# Patient Record
Sex: Female | Born: 1975
Health system: Southern US, Community
[De-identification: ages and names within clinical notes are randomized; demographics above are authoritative.]

## PROBLEM LIST (undated history)

## (undated) DIAGNOSIS — O139 Gestational [pregnancy-induced] hypertension without significant proteinuria, unspecified trimester: Secondary | ICD-10-CM

## (undated) DIAGNOSIS — F419 Anxiety disorder, unspecified: Secondary | ICD-10-CM

## (undated) DIAGNOSIS — I499 Cardiac arrhythmia, unspecified: Secondary | ICD-10-CM

## (undated) DIAGNOSIS — Z8489 Family history of other specified conditions: Secondary | ICD-10-CM

## (undated) DIAGNOSIS — K5900 Constipation, unspecified: Secondary | ICD-10-CM

## (undated) DIAGNOSIS — J45909 Unspecified asthma, uncomplicated: Secondary | ICD-10-CM

## (undated) DIAGNOSIS — R51 Headache: Secondary | ICD-10-CM

## (undated) DIAGNOSIS — I10 Essential (primary) hypertension: Secondary | ICD-10-CM

## (undated) DIAGNOSIS — K219 Gastro-esophageal reflux disease without esophagitis: Secondary | ICD-10-CM

## (undated) HISTORY — DX: Essential (primary) hypertension: I10

## (undated) HISTORY — DX: Constipation, unspecified: K59.00

## (undated) HISTORY — DX: Unspecified asthma, uncomplicated: J45.909

---

## 2007-12-22 ENCOUNTER — Emergency Department (HOSPITAL_BASED_OUTPATIENT_CLINIC_OR_DEPARTMENT_OTHER): Admission: EM | Admit: 2007-12-22 | Discharge: 2007-12-22 | Payer: Self-pay | Admitting: Emergency Medicine

## 2009-08-13 ENCOUNTER — Inpatient Hospital Stay (HOSPITAL_COMMUNITY): Admission: AD | Admit: 2009-08-13 | Discharge: 2009-08-16 | Payer: Self-pay | Admitting: Obstetrics

## 2010-05-01 LAB — CBC
HCT: 30.8 % — ABNORMAL LOW (ref 36.0–46.0)
HCT: 37 % (ref 36.0–46.0)
Hemoglobin: 10.8 g/dL — ABNORMAL LOW (ref 12.0–15.0)
Hemoglobin: 12.6 g/dL (ref 12.0–15.0)
MCH: 31.1 pg (ref 26.0–34.0)
MCHC: 34.1 g/dL (ref 30.0–36.0)
MCHC: 34.2 g/dL (ref 30.0–36.0)
MCV: 91.3 fL (ref 78.0–100.0)
Platelets: 160 10*3/uL (ref 150–400)
Platelets: 200 10*3/uL (ref 150–400)
RBC: 3.39 MIL/uL — ABNORMAL LOW (ref 3.87–5.11)
RBC: 4.04 MIL/uL (ref 3.87–5.11)

## 2010-05-01 LAB — COMPREHENSIVE METABOLIC PANEL
ALT: 13 U/L (ref 0–35)
AST: 17 U/L (ref 0–37)
Albumin: 2.8 g/dL — ABNORMAL LOW (ref 3.5–5.2)
CO2: 21 mEq/L (ref 19–32)
Calcium: 9.1 mg/dL (ref 8.4–10.5)
Chloride: 106 mEq/L (ref 96–112)
Creatinine, Ser: 0.5 mg/dL (ref 0.4–1.2)
GFR calc Af Amer: >60 mL/min (ref >60)
Total Protein: 6.1 g/dL (ref 6.0–8.3)

## 2010-05-01 LAB — RPR: RPR Ser Ql: NONREACTIVE

## 2010-05-01 LAB — RH IMMUNE GLOB WKUP(>/=20WKS)(NOT WOMEN'S HOSP)

## 2010-11-15 LAB — BASIC METABOLIC PANEL
BUN: 9
Creatinine, Ser: 0.7
GFR calc Af Amer: >60
Glucose, Bld: 96
Potassium: 3.8

## 2010-11-15 LAB — CBC
HCT: 40.5
Hemoglobin: 13.3
MCHC: 32.8
Platelets: 302

## 2010-11-15 LAB — PREGNANCY, URINE: Preg Test, Ur: NEGATIVE

## 2010-11-15 LAB — DIFFERENTIAL
Basophils Absolute: 0
Eosinophils Absolute: 0.1
Eosinophils Relative: 1
Lymphs Abs: 1.2
Monocytes Absolute: 0.3
Neutro Abs: 6.8
Neutrophils Relative %: 81 — ABNORMAL HIGH

## 2011-09-14 LAB — HM PAP SMEAR

## 2011-11-23 LAB — OB RESULTS CONSOLE GC/CHLAMYDIA
Chlamydia: NEGATIVE
Gonorrhea: NEGATIVE

## 2011-11-23 LAB — OB RESULTS CONSOLE RPR: RPR: NONREACTIVE

## 2011-11-23 LAB — OB RESULTS CONSOLE HIV ANTIBODY (ROUTINE TESTING): HIV: NONREACTIVE

## 2012-04-12 ENCOUNTER — Other Ambulatory Visit: Payer: Self-pay | Admitting: Obstetrics and Gynecology

## 2012-05-20 ENCOUNTER — Encounter (HOSPITAL_COMMUNITY): Payer: Self-pay | Admitting: Pharmacy Technician

## 2012-05-23 ENCOUNTER — Encounter (HOSPITAL_COMMUNITY): Payer: Self-pay

## 2012-05-27 ENCOUNTER — Encounter (HOSPITAL_COMMUNITY): Payer: Self-pay

## 2012-05-27 ENCOUNTER — Encounter (HOSPITAL_COMMUNITY)
Admission: RE | Admit: 2012-05-27 | Discharge: 2012-05-27 | Disposition: A | Discharge status: Home / Self Care | Payer: 59 | Source: Ambulatory Visit | Attending: Obstetrics and Gynecology | Admitting: Obstetrics and Gynecology

## 2012-05-27 HISTORY — DX: Cardiac arrhythmia, unspecified: I49.9

## 2012-05-27 HISTORY — DX: Gastro-esophageal reflux disease without esophagitis: K21.9

## 2012-05-27 HISTORY — DX: Headache: R51

## 2012-05-27 HISTORY — DX: Anxiety disorder, unspecified: F41.9

## 2012-05-27 LAB — CBC
Hemoglobin: 11.7 g/dL — ABNORMAL LOW (ref 12.0–15.0)
MCH: 30.3 pg (ref 26.0–34.0)
RBC: 3.86 MIL/uL — ABNORMAL LOW (ref 3.87–5.11)

## 2012-05-27 LAB — RPR: RPR Ser Ql: NONREACTIVE

## 2012-05-27 NOTE — Patient Instructions (Addendum)
   Your procedure is scheduled on:Wednesday April 16th  Enter through the [employer] of [facility] at:7am Pick up the phone at the desk and dial [phone] and inform us of your arrival.  Please call this number if you have any problems the morning of surgery: [phone]  Remember: Do not eat or drink anything after midnight on Tuesday Take these medicines the morning of surgery with a SIP OF WATER: Zantac  Do not wear jewelry, make-up, or FINGER nail polish No metal in your hair or on your body. Do not wear lotions, powders, perfumes. You may wear deodorant.  Please use your CHG wash as directed prior to surgery.  Do not shave anywhere for at least 12 hours prior to first CHG shower.  Do not bring valuables to the hospital.  Leave suitcase in the car. After Surgery it may be brought to your room. For patients being admitted to the hospital, checkout time is 11:00am the day of discharge.

## 2012-05-29 ENCOUNTER — Encounter (HOSPITAL_COMMUNITY)
Admission: AD | Disposition: A | Discharge status: Home / Self Care | Payer: Self-pay | Source: Ambulatory Visit | Attending: Obstetrics and Gynecology

## 2012-05-29 ENCOUNTER — Inpatient Hospital Stay (HOSPITAL_COMMUNITY): Payer: 59 | Admitting: Anesthesiology

## 2012-05-29 ENCOUNTER — Encounter (HOSPITAL_COMMUNITY): Payer: Self-pay | Admitting: Anesthesiology

## 2012-05-29 ENCOUNTER — Inpatient Hospital Stay (HOSPITAL_COMMUNITY)
Admission: AD | Admit: 2012-05-29 | Discharge: 2012-06-01 | DRG: 766 | Disposition: A | Discharge status: Home / Self Care | Payer: 59 | Source: Ambulatory Visit | Attending: Obstetrics and Gynecology | Admitting: Obstetrics and Gynecology

## 2012-05-29 DIAGNOSIS — O34219 Maternal care for unspecified type scar from previous cesarean delivery: Principal | ICD-10-CM | POA: Diagnosis present

## 2012-05-29 SURGERY — Surgical Case
Anesthesia: Spinal | Site: Abdomen | Wound class: Clean Contaminated

## 2012-05-29 MED ORDER — SODIUM CHLORIDE 0.9 % IJ SOLN: 3.0000 mL | INTRAMUSCULAR | Status: DC | PRN

## 2012-05-29 MED ORDER — IBUPROFEN 600 MG PO TABS
600.0000 mg | ORAL_TABLET | Freq: Four times a day (QID) | ORAL | Status: DC
  Administered 2012-05-29 – 2012-06-01 (×11): 600 mg via ORAL
  Filled 2012-05-29 (×11): qty 1

## 2012-05-29 MED ORDER — CEFAZOLIN SODIUM-DEXTROSE 2-3 GM-% IV SOLR: 2.0000 g | INTRAVENOUS | Status: DC

## 2012-05-29 MED ORDER — BUPIVACAINE IN DEXTROSE 0.75-8.25 % IT SOLN
INTRATHECAL | Status: DC | PRN
  Administered 2012-05-29: 1.8 mL via INTRATHECAL

## 2012-05-29 MED ORDER — LACTATED RINGERS IV SOLN
INTRAVENOUS | Status: DC
  Administered 2012-05-29 (×3): via INTRAVENOUS

## 2012-05-29 MED ORDER — METOCLOPRAMIDE HCL 5 MG/ML IJ SOLN: 10.0000 mg | Freq: Three times a day (TID) | INTRAMUSCULAR | Status: DC | PRN

## 2012-05-29 MED ORDER — ONDANSETRON HCL 4 MG/2ML IJ SOLN
INTRAMUSCULAR | Status: AC
  Filled 2012-05-29: qty 2

## 2012-05-29 MED ORDER — DIPHENHYDRAMINE HCL 25 MG PO CAPS: 25.0000 mg | ORAL_CAPSULE | Freq: Four times a day (QID) | ORAL | Status: DC | PRN

## 2012-05-29 MED ORDER — SCOPOLAMINE 1 MG/3DAYS TD PT72: 1.0000 | MEDICATED_PATCH | Freq: Once | TRANSDERMAL | Status: DC

## 2012-05-29 MED ORDER — NALOXONE HCL 1 MG/ML IJ SOLN
1.0000 ug/kg/h | INTRAVENOUS | Status: DC | PRN
  Filled 2012-05-29: qty 2

## 2012-05-29 MED ORDER — TETANUS-DIPHTH-ACELL PERTUSSIS 5-2.5-18.5 LF-MCG/0.5 IM SUSP: 0.5000 mL | Freq: Once | INTRAMUSCULAR | Status: DC

## 2012-05-29 MED ORDER — MEPERIDINE HCL 25 MG/ML IJ SOLN: 6.2500 mg | INTRAMUSCULAR | Status: DC | PRN

## 2012-05-29 MED ORDER — DIBUCAINE 1 % RE OINT: 1.0000 "application " | TOPICAL_OINTMENT | RECTAL | Status: DC | PRN

## 2012-05-29 MED ORDER — FENTANYL CITRATE 0.05 MG/ML IJ SOLN
INTRAMUSCULAR | Status: DC | PRN
  Administered 2012-05-29: 25 ug via INTRATHECAL

## 2012-05-29 MED ORDER — KETOROLAC TROMETHAMINE 30 MG/ML IJ SOLN: 30.0000 mg | Freq: Four times a day (QID) | INTRAMUSCULAR | Status: DC | PRN

## 2012-05-29 MED ORDER — ONDANSETRON HCL 4 MG/2ML IJ SOLN: 4.0000 mg | Freq: Three times a day (TID) | INTRAMUSCULAR | Status: DC | PRN

## 2012-05-29 MED ORDER — SIMETHICONE 80 MG PO CHEW
80.0000 mg | CHEWABLE_TABLET | Freq: Three times a day (TID) | ORAL | Status: DC
  Administered 2012-05-29 – 2012-06-01 (×8): 80 mg via ORAL

## 2012-05-29 MED ORDER — SCOPOLAMINE 1 MG/3DAYS TD PT72
MEDICATED_PATCH | TRANSDERMAL | Status: AC
  Administered 2012-05-29: 1.5 mg via TRANSDERMAL
  Filled 2012-05-29: qty 1

## 2012-05-29 MED ORDER — WITCH HAZEL-GLYCERIN EX PADS: 1.0000 "application " | MEDICATED_PAD | CUTANEOUS | Status: DC | PRN

## 2012-05-29 MED ORDER — NALBUPHINE HCL 10 MG/ML IJ SOLN
5.0000 mg | INTRAMUSCULAR | Status: DC | PRN
  Filled 2012-05-29: qty 1

## 2012-05-29 MED ORDER — ACETAMINOPHEN 10 MG/ML IV SOLN
1000.0000 mg | Freq: Four times a day (QID) | INTRAVENOUS | Status: AC | PRN
  Filled 2012-05-29: qty 100

## 2012-05-29 MED ORDER — SENNOSIDES-DOCUSATE SODIUM 8.6-50 MG PO TABS
2.0000 | ORAL_TABLET | Freq: Every day | ORAL | Status: DC
  Administered 2012-05-29 – 2012-05-31 (×3): 2 via ORAL

## 2012-05-29 MED ORDER — DIPHENHYDRAMINE HCL 50 MG/ML IJ SOLN: 12.5000 mg | INTRAMUSCULAR | Status: DC | PRN

## 2012-05-29 MED ORDER — LACTATED RINGERS IV SOLN
Freq: Once | INTRAVENOUS | Status: AC
  Administered 2012-05-29: 09:00:00 via INTRAVENOUS

## 2012-05-29 MED ORDER — ONDANSETRON HCL 4 MG/2ML IJ SOLN
INTRAMUSCULAR | Status: DC | PRN
  Administered 2012-05-29: 4 mg via INTRAVENOUS

## 2012-05-29 MED ORDER — ONDANSETRON HCL 4 MG/2ML IJ SOLN: 4.0000 mg | INTRAMUSCULAR | Status: DC | PRN

## 2012-05-29 MED ORDER — LACTATED RINGERS IV SOLN
INTRAVENOUS | Status: DC
  Administered 2012-05-29: 18:00:00 via INTRAVENOUS

## 2012-05-29 MED ORDER — SIMETHICONE 80 MG PO CHEW
80.0000 mg | CHEWABLE_TABLET | ORAL | Status: DC | PRN
  Administered 2012-05-29 – 2012-05-30 (×2): 80 mg via ORAL

## 2012-05-29 MED ORDER — NALBUPHINE HCL 10 MG/ML IJ SOLN: 5.0000 mg | INTRAMUSCULAR | Status: DC | PRN

## 2012-05-29 MED ORDER — PROMETHAZINE HCL 25 MG/ML IJ SOLN: 6.2500 mg | INTRAMUSCULAR | Status: DC | PRN

## 2012-05-29 MED ORDER — OXYTOCIN 10 UNIT/ML IJ SOLN
INTRAMUSCULAR | Status: AC
  Filled 2012-05-29: qty 4

## 2012-05-29 MED ORDER — MORPHINE SULFATE (PF) 0.5 MG/ML IJ SOLN
INTRAMUSCULAR | Status: DC | PRN
  Administered 2012-05-29: .15 mg via INTRATHECAL

## 2012-05-29 MED ORDER — OXYTOCIN 40 UNITS IN LACTATED RINGERS INFUSION - SIMPLE MED
INTRAVENOUS | Status: DC | PRN
  Administered 2012-05-29: 40 [IU] via INTRAVENOUS

## 2012-05-29 MED ORDER — LANOLIN HYDROUS EX OINT: 1.0000 "application " | TOPICAL_OINTMENT | CUTANEOUS | Status: DC | PRN

## 2012-05-29 MED ORDER — DIPHENHYDRAMINE HCL 25 MG PO CAPS: 25.0000 mg | ORAL_CAPSULE | ORAL | Status: DC | PRN

## 2012-05-29 MED ORDER — ONDANSETRON HCL 4 MG PO TABS: 4.0000 mg | ORAL_TABLET | ORAL | Status: DC | PRN

## 2012-05-29 MED ORDER — PRENATAL MULTIVITAMIN CH
1.0000 | ORAL_TABLET | Freq: Every day | ORAL | Status: DC
  Administered 2012-05-30 – 2012-06-01 (×3): 1 via ORAL
  Filled 2012-05-29 (×3): qty 1

## 2012-05-29 MED ORDER — SCOPOLAMINE 1 MG/3DAYS TD PT72
1.0000 | MEDICATED_PATCH | Freq: Once | TRANSDERMAL | Status: DC
  Filled 2012-05-29: qty 1

## 2012-05-29 MED ORDER — PHENYLEPHRINE 40 MCG/ML (10ML) SYRINGE FOR IV PUSH (FOR BLOOD PRESSURE SUPPORT)
PREFILLED_SYRINGE | INTRAVENOUS | Status: AC
  Filled 2012-05-29: qty 5

## 2012-05-29 MED ORDER — DIPHENHYDRAMINE HCL 50 MG/ML IJ SOLN: 25.0000 mg | INTRAMUSCULAR | Status: DC | PRN

## 2012-05-29 MED ORDER — ZOLPIDEM TARTRATE 5 MG PO TABS: 5.0000 mg | ORAL_TABLET | Freq: Every evening | ORAL | Status: DC | PRN

## 2012-05-29 MED ORDER — CEFAZOLIN SODIUM-DEXTROSE 2-3 GM-% IV SOLR
INTRAVENOUS | Status: AC
  Administered 2012-05-29: 2 g via INTRAVENOUS
  Filled 2012-05-29: qty 50

## 2012-05-29 MED ORDER — FENTANYL CITRATE 0.05 MG/ML IJ SOLN
INTRAMUSCULAR | Status: AC
  Filled 2012-05-29: qty 2

## 2012-05-29 MED ORDER — OXYTOCIN 40 UNITS IN LACTATED RINGERS INFUSION - SIMPLE MED: 62.5000 mL/h | INTRAVENOUS | Status: AC

## 2012-05-29 MED ORDER — FENTANYL CITRATE 0.05 MG/ML IJ SOLN: 25.0000 ug | INTRAMUSCULAR | Status: DC | PRN

## 2012-05-29 MED ORDER — NALOXONE HCL 0.4 MG/ML IJ SOLN: 0.4000 mg | INTRAMUSCULAR | Status: DC | PRN

## 2012-05-29 MED ORDER — OXYCODONE-ACETAMINOPHEN 5-325 MG PO TABS
1.0000 | ORAL_TABLET | ORAL | Status: DC | PRN
  Administered 2012-05-30: 1 via ORAL
  Administered 2012-05-31 (×2): 0.5 via ORAL
  Filled 2012-05-29 (×4): qty 1

## 2012-05-29 MED ORDER — KETOROLAC TROMETHAMINE 30 MG/ML IJ SOLN: 15.0000 mg | Freq: Once | INTRAMUSCULAR | Status: DC | PRN

## 2012-05-29 MED ORDER — MORPHINE SULFATE 0.5 MG/ML IJ SOLN
INTRAMUSCULAR | Status: AC
  Filled 2012-05-29: qty 10

## 2012-05-29 MED ORDER — EPHEDRINE 5 MG/ML INJ
INTRAVENOUS | Status: AC
  Filled 2012-05-29: qty 10

## 2012-05-29 MED ORDER — NALBUPHINE HCL 10 MG/ML IJ SOLN: 2.5000 mg | INTRAMUSCULAR | Status: DC | PRN

## 2012-05-29 MED ORDER — FERROUS SULFATE 325 (65 FE) MG PO TABS
325.0000 mg | ORAL_TABLET | Freq: Every day | ORAL | Status: DC
  Administered 2012-05-30 – 2012-06-01 (×3): 325 mg via ORAL
  Filled 2012-05-29 (×3): qty 1

## 2012-05-29 MED ORDER — PHENYLEPHRINE HCL 10 MG/ML IJ SOLN
INTRAMUSCULAR | Status: DC | PRN
  Administered 2012-05-29: 80 ug via INTRAVENOUS
  Administered 2012-05-29: 40 ug via INTRAVENOUS
  Administered 2012-05-29: 80 ug via INTRAVENOUS
  Administered 2012-05-29: 40 ug via INTRAVENOUS
  Administered 2012-05-29: 80 ug via INTRAVENOUS

## 2012-05-29 MED ORDER — KETOROLAC TROMETHAMINE 30 MG/ML IJ SOLN
INTRAMUSCULAR | Status: AC
  Administered 2012-05-29: 30 mg via INTRAVENOUS
  Filled 2012-05-29: qty 1

## 2012-05-29 MED ORDER — MENTHOL 3 MG MT LOZG: 1.0000 | LOZENGE | OROMUCOSAL | Status: DC | PRN

## 2012-05-29 MED ORDER — MIDAZOLAM HCL 2 MG/2ML IJ SOLN: 0.5000 mg | Freq: Once | INTRAMUSCULAR | Status: DC | PRN

## 2012-05-29 SURGICAL SUPPLY — 29 items
CLOTH BEACON ORANGE TIMEOUT ST (SAFETY) ×2 IMPLANT
DRAPE LG THREE QUARTER DISP (DRAPES) ×2 IMPLANT
DRSG OPSITE POSTOP 4X10 (GAUZE/BANDAGES/DRESSINGS) ×2 IMPLANT
DURAPREP 26ML APPLICATOR (WOUND CARE) ×2 IMPLANT
ELECT REM PT RETURN 9FT ADLT (ELECTROSURGICAL) ×2
ELECTRODE REM PT RTRN 9FT ADLT (ELECTROSURGICAL) ×1 IMPLANT
EXTRACTOR VACUUM M CUP 4 TUBE (SUCTIONS) IMPLANT
GLOVE BIO SURGEON STRL SZ 6.5 (GLOVE) ×2 IMPLANT
GLOVE BIOGEL PI IND STRL 6.5 (GLOVE) ×1 IMPLANT
GLOVE BIOGEL PI INDICATOR 6.5 (GLOVE) ×1
GOWN STRL REIN XL XLG (GOWN DISPOSABLE) ×4 IMPLANT
KIT ABG SYR 3ML LUER SLIP (SYRINGE) IMPLANT
NEEDLE HYPO 25X5/8 SAFETYGLIDE (NEEDLE) IMPLANT
NS IRRIG 1000ML POUR BTL (IV SOLUTION) ×2 IMPLANT
PACK C SECTION WH (CUSTOM PROCEDURE TRAY) ×2 IMPLANT
PAD OB MATERNITY 4.3X12.25 (PERSONAL CARE ITEMS) ×2 IMPLANT
RTRCTR C-SECT PINK 25CM LRG (MISCELLANEOUS) ×2 IMPLANT
SLEEVE SCD COMPRESS KNEE MED (MISCELLANEOUS) IMPLANT
STAPLER VISISTAT 35W (STAPLE) ×2 IMPLANT
SUT MON AB 2-0 CT1 27 (SUTURE) ×2 IMPLANT
SUT MON AB 4-0 PS1 27 (SUTURE) IMPLANT
SUT PDS AB 0 CTX 60 (SUTURE) IMPLANT
SUT PLAIN 2 0 XLH (SUTURE) IMPLANT
SUT VIC AB 0 CTX 36 (SUTURE) ×4
SUT VIC AB 0 CTX36XBRD ANBCTRL (SUTURE) ×4 IMPLANT
SUT VIC AB 4-0 KS 27 (SUTURE) IMPLANT
TOWEL OR 17X24 6PK STRL BLUE (TOWEL DISPOSABLE) ×6 IMPLANT
TRAY FOLEY CATH 14FR (SET/KITS/TRAYS/PACK) ×2 IMPLANT
WATER STERILE IRR 1000ML POUR (IV SOLUTION) ×2 IMPLANT

## 2012-05-29 NOTE — Brief Op Note (Signed)
05/29/2012  9:25 AM  PATIENT:  Cassie Kim  37 y.o. female  PRE-OPERATIVE DIAGNOSIS:  REPEAT cesarean section   POST-OPERATIVE DIAGNOSIS:  REPEAT cesarean section, Lower uterine segment extremely thin with window to amniotic sac that was quite large,  This was discussed with patient and husband at time of surgery.  PROCEDURE:  Procedure(s): CESAREAN SECTION (N/A)  SURGEON:  Surgeon(s) and Role:    * Philip Aspen, DO - Primary    * Miguel Aschoff, MD - Assisting    ANESTHESIA:   spinal  EBL:  Total I/O In: 3100 [I.V.:3100] Out: 700 [Urine:200; Blood:500]  BLOOD ADMINISTERED:none  DRAINS: none   LOCAL MEDICATIONS USED:  NONE  SPECIMEN:  Source of Specimen:  cord blood  DISPOSITION OF SPECIMEN:  N/A  COUNTS:  YES  TOURNIQUET:  * No tourniquets in log *  DICTATION: .Other Dictation: Dictation Number    PLAN OF CARE: Admit to inpatient   PATIENT DISPOSITION:  PACU - hemodynamically stable.   Delay start of Pharmacological VTE agent (>24hrs) due to surgical blood loss or risk of bleeding: N/A

## 2012-05-29 NOTE — Anesthesia Postprocedure Evaluation (Signed)
  Anesthesia Post-op Note  Patient: Cassie Kim  Procedure(s) Performed: Procedure(s): CESAREAN SECTION (N/A)  Patient Location: PACU  Anesthesia Type:Spinal  Level of Consciousness: awake, alert  and oriented  Airway and Oxygen Therapy: Patient Spontanous Breathing  Post-op Pain: none  Post-op Assessment: Post-op Vital signs reviewed, Patient's Cardiovascular Status Stable, Respiratory Function Stable, Patent Airway, No signs of Nausea or vomiting, Pain level controlled, No headache, No backache, No residual numbness and No residual motor weakness  Post-op Vital Signs: Reviewed and stable  Complications: No apparent anesthesia complications

## 2012-05-29 NOTE — Anesthesia Procedure Notes (Signed)
Spinal  Patient location during procedure: OR Start time: 05/29/2012 8:25 AM Staffing Anesthesiologist: Jevin Camino A. Performed by: anesthesiologist  Preanesthetic Checklist Completed: patient identified, site marked, surgical consent, pre-op evaluation, timeout performed, IV checked, risks and benefits discussed and monitors and equipment checked Spinal Block Patient position: sitting Prep: site prepped and draped and DuraPrep Patient monitoring: heart rate, cardiac monitor, continuous pulse ox and blood pressure Approach: midline Location: L3-4 Injection technique: single-shot Needle Needle type: Sprotte  Needle gauge: 24 G Needle length: 9 cm Needle insertion depth: 5 cm Assessment Sensory level: T4 Additional Notes Patient tolerated procedure well. Adequate sensory level.

## 2012-05-29 NOTE — Transfer of Care (Signed)
Immediate Anesthesia Transfer of Care Note  Patient: Cassie Kim  Procedure(s) Performed: Procedure(s): CESAREAN SECTION (N/A)  Patient Location: PACU  Anesthesia Type:Spinal  Level of Consciousness: awake, alert  and oriented  Airway & Oxygen Therapy: Patient Spontanous Breathing  Post-op Assessment: Report given to PACU RN and Post -op Vital signs reviewed and stable  Post vital signs: Reviewed and stable  Complications: No apparent anesthesia complications

## 2012-05-29 NOTE — Anesthesia Preprocedure Evaluation (Addendum)
Anesthesia Evaluation  Patient identified by MRN, date of birth, ID band Patient awake    Reviewed: Allergy & Precautions, H&P , NPO status , Patient's Chart, lab work & pertinent test results  Airway Mallampati: I TM Distance: >3 FB Neck ROM: Full    Dental no notable dental hx. (+) Teeth Intact and Chipped,    Pulmonary neg pulmonary ROS,  breath sounds clear to auscultation  Pulmonary exam normal       Cardiovascular Exercise Tolerance: Good + dysrhythmias Rhythm:regular Rate:Normal  Hx/o benign palpitations   Neuro/Psych  Headaches, Anxiety negative neurological ROS     GI/Hepatic negative GI ROS, Neg liver ROS, GERD-  Controlled and Medicated,  Endo/Other  negative endocrine ROS  Renal/GU negative Renal ROS  negative genitourinary   Musculoskeletal negative musculoskeletal ROS (+)   Abdominal Normal abdominal exam  (+)   Peds  Hematology negative hematology ROS (+)   Anesthesia Other Findings GERD (gastroesophageal reflux disease)   Takes Zantac Anxiety   Was on Zoloft & Ativan pre-pregnancy prn    Dysrhythmia   h/o palpitations with work-up in 2011 [facility] cardiology Headache           Reproductive/Obstetrics (+) Pregnancy Previous C/Section x 2                          Anesthesia Physical Anesthesia Plan  ASA: II  Anesthesia Plan: Spinal   Post-op Pain Management:    Induction:   Airway Management Planned: Natural Airway  Additional Equipment:   Intra-op Plan:   Post-operative Plan:   Informed Consent: I have reviewed the patients History and Physical, chart, labs and discussed the procedure including the risks, benefits and alternatives for the proposed anesthesia with the patient or authorized representative who has indicated his/her understanding and acceptance.   Dental advisory given  Plan Discussed with: Anesthesiologist, CRNA and Surgeon  Anesthesia Plan  Comments:        Anesthesia Quick Evaluation

## 2012-05-29 NOTE — Op Note (Signed)
Cassie Kim, Cassie Kim NO.:  [id number]  MEDICAL RECORD NO.:  [id number]  LOCATION:  9104                          FACILITY:  [facility]  PHYSICIAN:  Philip Aspen, DO    DATE OF BIRTH:  1975/12/02  DATE OF PROCEDURE:  05/29/2012 DATE OF DISCHARGE:                              OPERATIVE REPORT   PREOPERATIVE DIAGNOSIS:  Desired repeat C-section x3.  POSTOPERATIVE DIAGNOSIS:  Desired repeat C-section x3.  PROCEDURE:  Low-transverse cesarean section.  SURGEON:  Philip Aspen, DO  ASSISTANT:  Miguel Aschoff, M.D.  ANESTHESIA:  Spinal.  IV FLUIDS:  3000 mL.  ESTIMATED BLOOD LOSS:  500 mL.  URINE OUTPUT:  200 mL clear.  SPECIMENS:  Cord blood.  ANTIBIOTICS:  2 g Ancef.  FINDINGS:  Infant in cephalic presentation with Apgars of 8 and 9.  No significant intra-abdominal pelvic scar tissue, however, the lower uterine segment contained a large window where only amniotic sac was visible prior to uterine incision.  COMPLICATIONS:  None.  CONDITION:  Stable to PACU.  DESCRIPTION OF PROCEDURE:  The patient was taken to the OR after consent was obtained.  She was prepped and draped in the normal sterile fashion in the dorsal supine position with a leftward tilt.  Spinal anesthesia was found to be adequate.  Pfannenstiel skin incision was made with a scalpel and carried down to the underlying layer of fascia with the Bovie cautery.  Fascia was incised with a scalpel and extended laterally with Mayo scissors.  Kocher clamps were placed at the superior aspect of the incision and rectus muscles were dissected off bluntly and sharply. Kocher clamps were then placed at the inferior aspect of the fascial incision, tented, and the rectus muscles dissected off bluntly and sharply.  The rectus muscles were separated at the midline with hemostats.  The peritoneal tissue was elevated and with good visualization, entered sharply.  The incision was extended laterally by lateral  traction.  Alexis self retractor was placed after the abdomen and pelvis were explored.  A bladder flap was created with Metzenbaum scissors and manual development.  The visible amniotic sac was entered sharply with scalpel as it bulged.  The infant's head was easily delivered with pressure and remaining body was delivered without difficulty.  The cord was clamped and cut and infant was handed off to the awaiting Neonatology.  Cord bloods were collected and the placenta was removed by gentle manual traction and gentle massage on the external surface of the uterus.  Also after the placenta was removed, the uterine cavity was also cleared of all clot and debris.  The uterine incision was closed with Vicryl in a running, locked fashion.  Second layer with horizontal Lembert imbrication was performed, and excellent hemostasis was noted.  Alexis self retractor was removed and area cleared of all clot and debris.  The peritoneum was grasped with Kelly clamps and closed with Monocryl in a running fashion and the rectus muscles were also reapproximated with 3 loops of Monocryl.  Fascia was reapproximated and closed with loop PDS.  The subcutaneous tissue was irrigated, dried, and minimal use of Bovie cautery was necessary for hemostasis.  The  skin was then reapproximated and closed with staples.  The patient tolerated the procedure well.  Sponge, lap, and needle counts were correct x2. The patient was taken to recovery in stable condition.          ______________________________ Philip Aspen, DO     [place]/MEDQ  D:  05/29/2012  T:  05/29/2012  Job:  [id number]

## 2012-05-29 NOTE — Anesthesia Postprocedure Evaluation (Signed)
Anesthesia Post Note  Patient: Cassie Kim  Procedure(s) Performed: Procedure(s): CESAREAN SECTION (N/A)  Anesthesia type: Spinal  Patient location: Mother Baby  Post pain: Pain level controlled  Post assessment: Post-op Vital signs reviewed  Last Vitals: BP 106/69  Pulse 70  Temp(Src) 37.1 C (Oral)  Resp 20  Wt 216 lb (97.977 kg)  BMI 33.82 kg/m2  SpO2 95%  LMP 08/30/2011  Post vital signs: Reviewed  Level of consciousness: awake  Complications: No apparent anesthesia complications

## 2012-05-29 NOTE — H&P (Signed)
37 y.o. [redacted]w[redacted]d  [id number] comes in for scheduled repeat c/s.  Pt has prior h/o c/s x 2, history of macrosomia.  She also has a hx of CHTN, baseline labs were wnl and BPs remained stable on no medication.  Otherwise has good fetal movement and no bleeding.  Past Medical History  Diagnosis Date  . GERD (gastroesophageal reflux disease)     Takes Zantac  . Anxiety     Was on Zoloft & Ativan pre-pregnancy prn  . Dysrhythmia     h/o palpitations with work-up in 2011 [facility] cardiology  . Headache     Past Surgical History  Procedure Laterality Date  . Cesarean section      OB History   Grav Para Term Preterm Abortions TAB SAB Ect Mult Living   4 3 3       3      # Outc Date GA Lbr Len/2nd Wgt Sex Del Anes PTL Lv   1 TRM 2003    F SVD EPI  Yes   2 TRM 2009    M CS   Yes   3 TRM 2011    M CS   Yes   4 CUR               History   Social History  . Marital Status: Married    Spouse Name: N/A    Number of Children: N/A  . Years of Education: N/A   Occupational History  . Not on file.   Social History Main Topics  . Smoking status: Never Smoker   . Smokeless tobacco: Not on file  . Alcohol Use: No  . Drug Use: No  . Sexually Active: Not on file   Other Topics Concern  . Not on file   Social History Narrative  . No narrative on file   Review of patient's allergies indicates no known allergies.    Prenatal Transfer Tool  Maternal Diabetes: No Genetic Screening: Declined Maternal Ultrasounds/Referrals: Normal Fetal Ultrasounds or other Referrals:  None Maternal Substance Abuse:  No Significant Maternal Medications:  None Significant Maternal Lab Results: Lab values include: Group B Strep negative  Other PNC: uncomplicated    There were no vitals filed for this visit.   Lungs/Cor:  NAD Abdomen:  soft, gravid Ex:  no cords, erythema SVE:  Deferred FHTs:  To be checked Pre=op Toco: Deferred   A/P   Admit for scheduled repeat c/s.  GBS Neg  Jasmen Emrich

## 2012-05-30 ENCOUNTER — Encounter (HOSPITAL_COMMUNITY): Payer: Self-pay | Admitting: Obstetrics and Gynecology

## 2012-05-30 LAB — CBC
HCT: 29.8 % — ABNORMAL LOW (ref 36.0–46.0)
Hemoglobin: 9.7 g/dL — ABNORMAL LOW (ref 12.0–15.0)
MCH: 29.8 pg (ref 26.0–34.0)
MCV: 91.4 fL (ref 78.0–100.0)
RBC: 3.26 MIL/uL — ABNORMAL LOW (ref 3.87–5.11)
WBC: 8.8 10*3/uL (ref 4.0–10.5)

## 2012-05-30 MED ORDER — FAMOTIDINE 20 MG PO TABS: 10.0000 mg | ORAL_TABLET | Freq: Every day | ORAL | Status: DC

## 2012-05-30 MED ORDER — FAMOTIDINE 20 MG PO TABS
20.0000 mg | ORAL_TABLET | Freq: Two times a day (BID) | ORAL | Status: DC
  Administered 2012-05-30 – 2012-06-01 (×5): 20 mg via ORAL
  Filled 2012-05-30 (×5): qty 1

## 2012-05-30 MED ORDER — RHO D IMMUNE GLOBULIN 1500 UNIT/2ML IJ SOLN
300.0000 ug | Freq: Once | INTRAMUSCULAR | Status: AC
  Administered 2012-05-30: 300 ug via INTRAMUSCULAR
  Filled 2012-05-30: qty 2

## 2012-05-30 NOTE — Progress Notes (Signed)
Patient is eating, ambulating, voiding.  Pain control is good.  No flatus.  Appropriate lochia.  Some dizziness when up to the bathroom.    Filed Vitals:   05/29/12 2030 05/29/12 2230 05/30/12 0230 05/30/12 0635  BP: 95/60 95/60 102/64 98/60  Pulse: 60 61 66 58  Temp: 97.7 F (36.5 C) 98.6 F (37 C) 98.2 F (36.8 C) 97.6 F (36.4 C)  TempSrc: Oral Oral Oral Oral  Resp: 18 18 18 18   Weight:      SpO2: 97% 96% 97% 98%    Fundus firm Perineum without swelling. Incision: minimal spots of blood on dressing, otherwise c/d/i Ext: no CT  Lab Results  Component Value Date   WBC 8.8 05/30/2012   HGB 9.7* 05/30/2012   HCT 29.8* 05/30/2012   MCV 91.4 05/30/2012   PLT 161 05/30/2012    --/--/A NEG (04/14 1105)  A/P Post op day #1 s/p Repeat c/s Encourage ambulation.  Routine care.     Philip Aspen

## 2012-05-31 LAB — TYPE AND SCREEN
Antibody Screen: POSITIVE
Unit division: 0

## 2012-05-31 LAB — RH IG WORKUP (INCLUDES ABO/RH)
ABO/RH(D): A NEG
Fetal Screen: NEGATIVE
Unit division: 0

## 2012-05-31 MED ORDER — OXYCODONE-ACETAMINOPHEN 5-325 MG PO TABS: 1.0000 | ORAL_TABLET | ORAL | Status: DC | PRN

## 2012-05-31 MED ORDER — LORAZEPAM 1 MG PO TABS
0.5000 mg | ORAL_TABLET | Freq: Once | ORAL | Status: AC
  Administered 2012-05-31: 0.5 mg via ORAL
  Filled 2012-05-31: qty 1

## 2012-05-31 NOTE — Discharge Summary (Signed)
Obstetric Discharge Summary Reason for Admission: cesarean section Prenatal Procedures: none Intrapartum Procedures: cesarean: low cervical, transverse Postpartum Procedures: Rho(D) Ig Complications-Operative and Postpartum: none Hemoglobin  Date Value Range Status  05/30/2012 9.7* 12.0 - 15.0 g/dL Final     HCT  Date Value Range Status  05/30/2012 29.8* 36.0 - 46.0 % Final    Discharge Diagnoses: Term Pregnancy-delivered  Discharge Information: Date: 05/31/2012 Activity: pelvic rest Diet: routine Medications: Iron and Percocet Condition: stable Instructions: refer to practice specific booklet Discharge to: home Follow-up Information   Follow up with CALLAHAN, SIDNEY, DO. Schedule an appointment as soon as possible for a visit in 4 weeks.   Contact information:   [address] [place] [address] [phone]       Newborn Data: Live born female  Birth Weight: 8 lb 5.9 oz (3795 g) APGAR: 8, 9  Home with mother.  Cassie Kim A 05/31/2012, 9:13 AM

## 2012-05-31 NOTE — Progress Notes (Signed)
Pt started to experience anxiety symptoms during college. She has taken medication to treat symptoms prior to pregnancy, which has worked well for her. She describes herself as a "little emotional" now but thinks her feelings are normal. She does not plan to restart medication upon discharge, as she would like to see how she manages without medicine. She agrees to contact her medical provider if symptoms become unmanageable. FOB is at the bedside, aware of history & supportive. CSW discussed PP depression symptoms with pt. Pt was receptive to information discussed & thanked CSW for consult. No barriers to discharge.      

## 2012-05-31 NOTE — Progress Notes (Signed)
  Patient is eating, ambulating, voiding.  Pain control is good.  Filed Vitals:   05/30/12 0900 05/30/12 1354 05/30/12 1754 05/31/12 0521  BP: 107/66 111/68 114/50 111/69  Pulse: 68 70 63 62  Temp: 98.5 F (36.9 C) 98.7 F (37.1 C) 97.6 F (36.4 C) 97.4 F (36.3 C)  TempSrc: Oral Oral Oral Oral  Resp: 18 18 18 18   Weight:      SpO2: 99% 99%  99%    lungs:   clear to auscultation cor:    RRR Abdomen:  soft, appropriate tenderness, incisions intact and without erythema or exudate ex:    no cords   Lab Results  Component Value Date   WBC 8.8 05/30/2012   HGB 9.7* 05/30/2012   HCT 29.8* 05/30/2012   MCV 91.4 05/30/2012   PLT 161 05/30/2012    --/--/A NEG (04/17 0620)/RI  A/P    Post operative day 2.  Routine post op and postpartum care.  Expect d/c tomorrow- pt desires to stay.  Percocet for pain control.   Baby Rh +- pt got Rhogam.

## 2012-06-01 NOTE — Progress Notes (Signed)
  Patient is eating, ambulating, voiding.  Pain control is good.  Filed Vitals:   05/30/12 1754 05/31/12 0521 05/31/12 1859 06/01/12 0500  BP: 114/50 111/69 132/76 136/82  Pulse: 63 62 63 70  Temp: 97.6 F (36.4 C) 97.4 F (36.3 C) 98.4 F (36.9 C) 99 F (37.2 C)  TempSrc: Oral Oral Oral Oral  Resp: 18 18 18 20   Weight:      SpO2:  99%      lungs:   clear to auscultation cor:    RRR Abdomen:  soft, appropriate tenderness, incisions intact and without erythema or exudate ex:    no cords   Lab Results  Component Value Date   WBC 8.8 05/30/2012   HGB 9.7* 05/30/2012   HCT 29.8* 05/30/2012   MCV 91.4 05/30/2012   PLT 161 05/30/2012    --/--/A NEG (04/17 0620)/RI  A/P    Post operative day 3.  Routine post op and postpartum care.  Expect d/c today.  Percocet for pain control.

## 2013-02-07 ENCOUNTER — Encounter: Payer: Self-pay | Admitting: Neurology

## 2013-02-10 ENCOUNTER — Encounter: Payer: Self-pay | Admitting: Neurology

## 2013-02-10 ENCOUNTER — Encounter (INDEPENDENT_AMBULATORY_CARE_PROVIDER_SITE_OTHER): Payer: Self-pay

## 2013-02-10 ENCOUNTER — Ambulatory Visit (INDEPENDENT_AMBULATORY_CARE_PROVIDER_SITE_OTHER): Payer: 59 | Admitting: Neurology

## 2013-02-10 VITALS — BP 146/87 | HR 86 | Ht 68.0 in | Wt 206.0 lb

## 2013-02-10 DIAGNOSIS — R253 Fasciculation: Secondary | ICD-10-CM

## 2013-02-10 DIAGNOSIS — R259 Unspecified abnormal involuntary movements: Secondary | ICD-10-CM

## 2013-02-10 NOTE — Progress Notes (Signed)
[facility]    Provider:  Dr Hosie Poisson Referring Provider: Farris Has, MD Primary Care Physician:  Farris Has, MD  CC:  Muscle spasms  HPI:  Cassie Kim is a 37 y.o. female here as a referral from Dr. Kateri Plummer for muscle spasms  Symptoms started in September 2012, had severe viral infection at that time with high fever, twitching started after that. Has generalized twitching of muscles, can be any muscle of extremities or trunk. Occurs daily, randomly throughout the day. Described as a brief twitching of the muscle, no jerking of the extremity. Lasts a few seconds and then resolves.No weakness noted. Has some episodes of blurry vision/double vision.No sensory changes. No gait instability. 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  Does not drink coffee or caffeine. Has history of anxiety/panic attacks.    Prior PCP thinks it is stress/anxiety related.   Review of Systems: Out of a complete 14 system review, the patient complains of only the following symptoms, and all other reviewed systems are negative.  positive blurred vision headache sleepiness restless legs tremor anxiety none of sleep joint swelling aching muscles allergies ringing in ears palpitations  History   Social History  . Marital Status: Married    Spouse Name: [name]    Number of Children: 6  . Years of Education: college   Occupational History  . Not on file.   Social History Main Topics  . Smoking status: Former Smoker    Quit date: 09/13/2000  . Smokeless tobacco: Never Used  . Alcohol Use: Yes     Comment: rarely  . Drug Use: No  . Sexual Activity: Not on file   Other Topics Concern  . Not on file   Social History Narrative   Patient is married Primary school teacher).   Patient is a homemaker, previously a Pension scheme manager.   Patient has 6 children (4 biological and 2 step-children).   Patient rarely drinks caffeine.          History reviewed. No  pertinent family history.  Past Medical History  Diagnosis Date  . GERD (gastroesophageal reflux disease)     Takes Zantac  . Anxiety     Was on Zoloft & Ativan pre-pregnancy prn  . Dysrhythmia     h/o palpitations with work-up in 2011 [facility] cardiology  . Headache(784.0)   . Hypertension   . Childhood asthma   . Constipation     Past Surgical History  Procedure Laterality Date  . Cesarean section    . Cesarean section N/A 05/29/2012    Procedure: CESAREAN SECTION;  Surgeon: Philip Aspen, DO;  Location: [facility] ORS;  Service: Obstetrics;  Laterality: N/A;    Current Outpatient Prescriptions  Medication Sig Dispense Refill  . ferrous sulfate 325 (65 FE) MG tablet Take 325 mg by mouth daily with breakfast.      . ranitidine (ZANTAC) 150 MG tablet Take 150 mg by mouth 2 (two) times daily. As needed      . sertraline (ZOLOFT) 50 MG tablet        No current facility-administered medications for this visit.    Allergies as of 02/10/2013  . (No Known Allergies)    Vitals: BP 146/87  Pulse 86  Ht 5\' 8"  (1.727 m)  Wt 206 lb (93.441 kg)  BMI 31.33 kg/m2  LMP 01/13/2013  Breastfeeding? No Last Weight:  Wt Readings from Last 1 Encounters:  02/10/13 206 lb (93.441 kg)   Last  Height:   Ht Readings from Last 1 Encounters:  02/10/13 5\' 8"  (1.727 m)     Physical exam: Exam: Gen: NAD, conversant Eyes: anicteric sclerae, moist conjunctivae HENT: Atraumatic, oropharynx clear Neck: Trachea midline; supple,  Lungs: CTA, no wheezing, rales, rhonic                          CV: RRR, no MRG Abdomen: Soft, non-tender;  Extremities: No peripheral edema  Skin: Normal temperature, no rash,  Psych: Appropriate affect, pleasant  Neuro: MS: AA&Ox3, appropriately interactive, normal affect   Speech: fluent w/o paraphasic error  Memory: good recent and remote recall  CN: PERRL, EOMI no nystagmus, no ptosis, sensation intact to LT V1-V3 bilat, face symmetric, no weakness, hearing  grossly intact, palate elevates symmetrically, shoulder shrug 5/5 bilat,  tongue protrudes midline, no fasiculations noted.  Motor: normal bulk and tone, no fasciculations noted Strength: 5/5  In all extremities  Coord: rapid alternating and point-to-point (FNF, HTS) movements intact.  Reflexes: symmetrical, bilat downgoing toes  Sens: LT intact in all extremities  Gait: posture, stance, stride and arm-swing normal. Tandem gait intact. Able to walk on heels and toes. Romberg absent.   Assessment:  After physical and neurologic examination, review of laboratory studies, imaging, neurophysiology testing and pre-existing records, assessment will be reviewed on the problem list.  Plan:  Treatment plan and additional workup will be reviewed under Problem List.  1)Fasciculations  37y/o woman presenting for initial evaluation of muscle fasciculations. Physical exam is unremarkable, no fasciculations, atrophy, abnormal reflexes were noted. 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 Less likely to be neurodegenerative process such as ALS. Based on patient's age and visual change history would also consider MS, though this again is less likely based on physical exam and history. Will check EMG nerve conduction study. Suggest checking calcium, magnesium, hepatic function. Patient notes she will have this done with her primary care physician at next blood draw. Would hold off on MRI the brain at this time as there is low suspicion the symptoms are present MS. Followup once EMG completed. Would benefit from treatment of anxiety depression, she notes she is followed by her primary care physician for treatment of this.  Elspeth Cho, DO  [facility] [address] [place], [place] [address]  Phone [phone] Fax [phone]

## 2013-02-10 NOTE — Patient Instructions (Signed)
Overall you are doing fairly well but I do want to suggest a few things today:   Remember to drink plenty of fluid, eat healthy meals and do not skip any meals. Try to eat protein with a every meal and eat a healthy snack such as fruit or nuts in between meals. Try to keep a regular sleep-wake schedule and try to exercise daily, particularly in the form of walking, 20-30 minutes a day, if you can.   As far as diagnostic testing:  1)EMG/NCS  Please also call us for any test results so we can go over those with you on the phone.  My clinical assistant and will answer any of your questions and relay your messages to me and also relay most of my messages to you.   Our phone number is [phone]. We also have an after hours call service for urgent matters and there is a physician on-call for urgent questions. For any emergencies you know to call 911 or go to the nearest emergency room

## 2013-02-11 ENCOUNTER — Telehealth: Payer: Self-pay | Admitting: *Deleted

## 2013-02-11 NOTE — Telephone Encounter (Signed)
Patient called back, all concerns answered. She will hold off on EMG/NCS for now, will work on treating anxiety/depression for suspected benign fasciculations due to anxiety/stress. If symptoms worsen or do not improve then will plan for EMG/NCS.

## 2013-02-21 ENCOUNTER — Encounter: Payer: 59 | Admitting: Neurology

## 2013-03-27 ENCOUNTER — Telehealth: Payer: Self-pay

## 2013-03-27 NOTE — Telephone Encounter (Signed)
Medication and allergies:  Reviewed and updated  90 day supply/mail order: n/a Local pharmacy:  [facility] on [address]   Immunizations due: Influenza-declined   A/P: No changes to personal, family history or past surgical hx PAP- 09/2011-negative, per patient Flu-Declined Tdap- 2011  To Discuss with Provider: Not at this time.

## 2013-03-28 ENCOUNTER — Encounter: Payer: Self-pay | Admitting: Family Medicine

## 2013-03-28 ENCOUNTER — Encounter (INDEPENDENT_AMBULATORY_CARE_PROVIDER_SITE_OTHER): Payer: Self-pay

## 2013-03-28 ENCOUNTER — Ambulatory Visit (INDEPENDENT_AMBULATORY_CARE_PROVIDER_SITE_OTHER): Payer: 59 | Admitting: Family Medicine

## 2013-03-28 VITALS — BP 120/70 | HR 106 | Temp 98.3°F | Ht 67.0 in | Wt 199.0 lb

## 2013-03-28 DIAGNOSIS — E669 Obesity, unspecified: Secondary | ICD-10-CM | POA: Insufficient documentation

## 2013-03-28 DIAGNOSIS — R253 Fasciculation: Secondary | ICD-10-CM

## 2013-03-28 DIAGNOSIS — R259 Unspecified abnormal involuntary movements: Secondary | ICD-10-CM

## 2013-03-28 DIAGNOSIS — R5383 Other fatigue: Secondary | ICD-10-CM

## 2013-03-28 DIAGNOSIS — R002 Palpitations: Secondary | ICD-10-CM

## 2013-03-28 DIAGNOSIS — F411 Generalized anxiety disorder: Secondary | ICD-10-CM

## 2013-03-28 DIAGNOSIS — R5381 Other malaise: Secondary | ICD-10-CM | POA: Insufficient documentation

## 2013-03-28 DIAGNOSIS — R011 Cardiac murmur, unspecified: Secondary | ICD-10-CM

## 2013-03-28 LAB — CBC WITH DIFFERENTIAL/PLATELET
Basophils Absolute: 0 10*3/uL (ref 0.0–0.1)
Basophils Relative: 0.3 % (ref 0.0–3.0)
EOS PCT: 2 % (ref 0.0–5.0)
Eosinophils Absolute: 0.1 10*3/uL (ref 0.0–0.7)
HEMATOCRIT: 39.4 % (ref 36.0–46.0)
Hemoglobin: 12.8 g/dL (ref 12.0–15.0)
LYMPHS ABS: 1.3 10*3/uL (ref 0.7–4.0)
Lymphocytes Relative: 20.4 % (ref 12.0–46.0)
MCHC: 32.5 g/dL (ref 30.0–36.0)
MCV: 90.3 fl (ref 78.0–100.0)
MONO ABS: 0.3 10*3/uL (ref 0.1–1.0)
Monocytes Relative: 4.8 % (ref 3.0–12.0)
Neutro Abs: 4.6 10*3/uL (ref 1.4–7.7)
Neutrophils Relative %: 72.5 % (ref 43.0–77.0)
PLATELETS: 286 10*3/uL (ref 150.0–400.0)
RBC: 4.36 Mil/uL (ref 3.87–5.11)
RDW: 13.8 % (ref 11.5–14.6)
WBC: 6.3 10*3/uL (ref 4.5–10.5)

## 2013-03-28 LAB — POCT URINALYSIS DIPSTICK
Bilirubin, UA: NEGATIVE
Blood, UA: NEGATIVE
Glucose, UA: NEGATIVE
KETONES UA: NEGATIVE
LEUKOCYTES UA: NEGATIVE
Nitrite, UA: NEGATIVE
PH UA: 7.5
PROTEIN UA: NEGATIVE
SPEC GRAV UA: 1.01
Urobilinogen, UA: 0.2

## 2013-03-28 LAB — T4, FREE: FREE T4: 0.76 ng/dL (ref 0.60–1.60)

## 2013-03-28 LAB — BASIC METABOLIC PANEL
BUN: 16 mg/dL (ref 6–23)
CHLORIDE: 100 meq/L (ref 96–112)
CO2: 24 meq/L (ref 19–32)
Calcium: 8.9 mg/dL (ref 8.4–10.5)
Creatinine, Ser: 0.9 mg/dL (ref 0.4–1.2)
GFR: 80.19 mL/min (ref >60.00)
GLUCOSE: 134 mg/dL — AB (ref 70–99)
POTASSIUM: 3.8 meq/L (ref 3.5–5.1)
Sodium: 133 mEq/L — ABNORMAL LOW (ref 135–145)

## 2013-03-28 LAB — MONONUCLEOSIS SCREEN: Mono Screen: NEGATIVE

## 2013-03-28 LAB — VITAMIN B12: Vitamin B-12: 753 pg/mL (ref 211–911)

## 2013-03-28 LAB — T3, FREE: T3, Free: 2.3 pg/mL (ref 2.3–4.2)

## 2013-03-28 LAB — TSH: TSH: 5.55 u[IU]/mL — ABNORMAL HIGH (ref 0.35–5.50)

## 2013-03-28 MED ORDER — SERTRALINE HCL 50 MG PO TABS: 50.0000 mg | ORAL_TABLET | Freq: Every day | ORAL | Status: DC

## 2013-03-28 MED ORDER — LORAZEPAM 0.5 MG PO TABS: 0.5000 mg | ORAL_TABLET | Freq: Three times a day (TID) | ORAL | Status: AC

## 2013-03-28 NOTE — Progress Notes (Signed)
Pre visit review using our clinic review tool, if applicable. No additional management support is needed unless otherwise documented below in the visit note. 
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
## 2013-03-28 NOTE — Patient Instructions (Addendum)

## 2013-03-28 NOTE — Assessment & Plan Note (Signed)
Check echo 

## 2013-03-28 NOTE — Assessment & Plan Note (Signed)
Improved with zoloft in past Restart zoloft F/u neuro if needed

## 2013-03-28 NOTE — Assessment & Plan Note (Signed)
Check labs 

## 2013-03-29 LAB — EPSTEIN-BARR VIRUS VCA ANTIBODY PANEL: EBV EA IGG: 55.5 U/mL — AB (ref <9.0)

## 2013-04-09 ENCOUNTER — Ambulatory Visit (HOSPITAL_BASED_OUTPATIENT_CLINIC_OR_DEPARTMENT_OTHER)
Admission: RE | Admit: 2013-04-09 | Discharge: 2013-04-09 | Disposition: A | Discharge status: Home / Self Care | Payer: 59 | Source: Ambulatory Visit | Attending: Family Medicine | Admitting: Family Medicine

## 2013-04-09 DIAGNOSIS — I379 Nonrheumatic pulmonary valve disorder, unspecified: Secondary | ICD-10-CM | POA: Insufficient documentation

## 2013-04-09 DIAGNOSIS — R011 Cardiac murmur, unspecified: Secondary | ICD-10-CM

## 2013-04-09 DIAGNOSIS — R002 Palpitations: Secondary | ICD-10-CM

## 2013-04-09 NOTE — Progress Notes (Signed)
  Echocardiogram 2D Echocardiogram has been performed.  Philipp Deputy 04/09/2013, 11:58 AM

## 2013-04-14 ENCOUNTER — Other Ambulatory Visit: Payer: Self-pay

## 2013-04-14 DIAGNOSIS — E039 Hypothyroidism, unspecified: Secondary | ICD-10-CM

## 2013-04-15 ENCOUNTER — Other Ambulatory Visit: Payer: 59

## 2013-04-24 ENCOUNTER — Encounter: Payer: Self-pay | Admitting: Family Medicine

## 2013-04-24 ENCOUNTER — Encounter: Payer: Self-pay | Admitting: Lab

## 2013-04-24 ENCOUNTER — Ambulatory Visit (INDEPENDENT_AMBULATORY_CARE_PROVIDER_SITE_OTHER): Payer: 59 | Admitting: Family Medicine

## 2013-04-24 VITALS — BP 118/80 | HR 84 | Temp 98.1°F | Wt 201.0 lb

## 2013-04-24 DIAGNOSIS — G4700 Insomnia, unspecified: Secondary | ICD-10-CM

## 2013-04-24 DIAGNOSIS — IMO0001 Reserved for inherently not codable concepts without codable children: Secondary | ICD-10-CM

## 2013-04-24 DIAGNOSIS — J302 Other seasonal allergic rhinitis: Secondary | ICD-10-CM

## 2013-04-24 DIAGNOSIS — F411 Generalized anxiety disorder: Secondary | ICD-10-CM

## 2013-04-24 DIAGNOSIS — R253 Fasciculation: Secondary | ICD-10-CM

## 2013-04-24 DIAGNOSIS — J309 Allergic rhinitis, unspecified: Secondary | ICD-10-CM

## 2013-04-24 DIAGNOSIS — M549 Dorsalgia, unspecified: Secondary | ICD-10-CM

## 2013-04-24 DIAGNOSIS — R259 Unspecified abnormal involuntary movements: Secondary | ICD-10-CM

## 2013-04-24 LAB — SEDIMENTATION RATE: SED RATE: 44 mm/h — AB (ref 0–22)

## 2013-04-24 LAB — VITAMIN B12: Vitamin B-12: >1500 pg/mL — ABNORMAL HIGH (ref 211–911)

## 2013-04-24 NOTE — Patient Instructions (Signed)

## 2013-04-24 NOTE — Progress Notes (Signed)
Pre visit review using our clinic review tool, if applicable. No additional management support is needed unless otherwise documented below in the visit note. 

## 2013-04-24 NOTE — Progress Notes (Signed)
Patient ID: Cassie Kim, female   DOB: 24-Jan-1976, 38 y.o.   MRN: [id number]   Subjective:    Patient ID: Cassie Kim, female    DOB: 1976-01-29, 38 y.o.   MRN: [id number] HPI Pt here to f/u anxiety but then had several other issues as well.  The anxiety has improved. Pt also states she went to neuro on her own because of fasiculations of eye----they agreed with [place] that it was probably the anxiety.  She also c/o hip/back pain ---no known injury Pt is also c/o insomnia/ snoring and daytime fatigue.          Objective:    BP 118/80  Pulse 84  Temp(Src) 98.1 F (36.7 C) (Oral)  Wt 201 lb (91.173 kg)  SpO2 98%  LMP 03/21/2013 General appearance: alert, cooperative, appears stated age and no distress Eyes: conjunctivae/corneas clear. PERRL, EOM's intact. Fundi benign. Ears: normal TM's and external ear canals both ears Nose: Nares normal. Septum midline. Mucosa normal. No drainage or sinus tenderness. Throat: lips, mucosa, and tongue normal; teeth and gums normal ---+ PND Neck: no adenopathy, supple, symmetrical, trachea midline and thyroid not enlarged, symmetric, no tenderness/mass/nodules Back: symmetric, no curvature. ROM normal. No CVA tenderness. Lungs: clear to auscultation bilaterally Heart: regular rate and rhythm, S1, S2 normal, no murmur, click, rub or gallop Neurologic: Alert and oriented X 3, normal strength and tone. Normal symmetric reflexes. Normal coordination and gait        Assessment & Plan:  1. Generalized anxiety disorder con't meds  2. Fasciculation May be from anxiety --- neuro agreed  3. Insomnia Refer to pulm for sleep study - Ambulatory referral to Pulmonology  4. Seasonal allergies Antihistamine, steroid nasal spray - Ambulatory referral to Pulmonology  5. Myalgia and myositis Check labs  - Vitamin D 1,25 dihydroxy - Vitamin B12 - Antinuclear Antib (ANA) - Sed Rate (ESR)  6. Back pain No known injury - DG Lumbar Spine Complete;  Future - DG Hip Complete Left; Future

## 2013-04-25 LAB — ANA: Anti Nuclear Antibody(ANA): NEGATIVE

## 2013-04-29 LAB — VITAMIN D 1,25 DIHYDROXY
Vitamin D 1, 25 (OH)2 Total: 77 pg/mL — ABNORMAL HIGH (ref 18–72)
Vitamin D2 1, 25 (OH)2: <8 pg/mL
Vitamin D3 1, 25 (OH)2: 77 pg/mL

## 2013-04-30 ENCOUNTER — Telehealth: Payer: Self-pay

## 2013-04-30 NOTE — Telephone Encounter (Signed)
Message copied by Ewing Schlein on Wed Apr 30, 2013  3:20 PM ------      Message from: Lucrezia Starch B      Created: Wed Apr 30, 2013  2:42 PM      Regarding: Labs      Contact: [phone]       Patient called and stated that she did view her lab work on [employer] but she has questions regarding them. Please advise.  ------

## 2013-05-01 ENCOUNTER — Encounter: Payer: Self-pay | Admitting: Family Medicine

## 2013-05-01 NOTE — Telephone Encounter (Signed)
Discussed with patient and she understands that per Dr.Lowne she is to come back and have her TSH repeated in 2 months. Apt has been scheduled      KP

## 2013-05-27 ENCOUNTER — Encounter: Payer: Self-pay | Admitting: Family Medicine

## 2013-06-10 ENCOUNTER — Other Ambulatory Visit: Payer: 59

## 2013-06-13 ENCOUNTER — Other Ambulatory Visit (INDEPENDENT_AMBULATORY_CARE_PROVIDER_SITE_OTHER): Payer: 59

## 2013-06-13 DIAGNOSIS — E039 Hypothyroidism, unspecified: Secondary | ICD-10-CM

## 2013-06-13 LAB — T4, FREE: Free T4: 0.83 ng/dL (ref 0.60–1.60)

## 2013-06-13 LAB — T3, FREE: T3, Free: 2.8 pg/mL (ref 2.3–4.2)

## 2013-06-13 LAB — TSH: TSH: 1.16 u[IU]/mL (ref 0.35–5.50)

## 2013-10-03 ENCOUNTER — Other Ambulatory Visit: Payer: Self-pay | Admitting: Family Medicine

## 2013-10-27 ENCOUNTER — Telehealth: Payer: Self-pay | Admitting: Family Medicine

## 2013-10-27 ENCOUNTER — Other Ambulatory Visit: Payer: Self-pay | Admitting: Family Medicine

## 2013-10-27 DIAGNOSIS — G4700 Insomnia, unspecified: Secondary | ICD-10-CM

## 2013-10-27 NOTE — Telephone Encounter (Signed)
Referral in

## 2013-10-27 NOTE — Telephone Encounter (Signed)
Requesting referral to Pulmonary for sleep study

## 2013-12-15 ENCOUNTER — Encounter: Payer: Self-pay | Admitting: Family Medicine

## 2014-02-09 ENCOUNTER — Institutional Professional Consult (permissible substitution): Payer: 59 | Admitting: Pulmonary Disease

## 2014-05-12 ENCOUNTER — Ambulatory Visit: Payer: Self-pay | Admitting: Interventional Cardiology

## 2014-08-04 ENCOUNTER — Telehealth: Payer: Self-pay | Admitting: *Deleted

## 2014-08-04 NOTE — Telephone Encounter (Signed)
Pt signed ROI request received via fax from [facility]. Forwarded to [place] to scan/email to medical records. JG/CMA

## 2015-02-16 ENCOUNTER — Ambulatory Visit
Admission: RE | Admit: 2015-02-16 | Discharge: 2015-02-16 | Disposition: A | Discharge status: Home / Self Care | Payer: 59 | Source: Ambulatory Visit | Attending: Family Medicine | Admitting: Family Medicine

## 2015-02-16 ENCOUNTER — Other Ambulatory Visit: Payer: Self-pay | Admitting: Family Medicine

## 2015-02-16 DIAGNOSIS — M25552 Pain in left hip: Secondary | ICD-10-CM

## 2015-06-08 ENCOUNTER — Other Ambulatory Visit: Payer: Self-pay | Admitting: Family Medicine

## 2015-06-08 DIAGNOSIS — R109 Unspecified abdominal pain: Secondary | ICD-10-CM

## 2015-06-15 ENCOUNTER — Ambulatory Visit
Admission: RE | Admit: 2015-06-15 | Discharge: 2015-06-15 | Disposition: A | Discharge status: Home / Self Care | Payer: 59 | Source: Ambulatory Visit | Attending: Family Medicine | Admitting: Family Medicine

## 2015-06-15 DIAGNOSIS — R109 Unspecified abdominal pain: Secondary | ICD-10-CM

## 2015-06-16 ENCOUNTER — Other Ambulatory Visit: Payer: Self-pay | Admitting: Family Medicine

## 2015-06-16 DIAGNOSIS — R109 Unspecified abdominal pain: Secondary | ICD-10-CM

## 2015-06-17 ENCOUNTER — Ambulatory Visit
Admission: RE | Admit: 2015-06-17 | Discharge: 2015-06-17 | Disposition: A | Discharge status: Home / Self Care | Payer: 59 | Source: Ambulatory Visit | Attending: Family Medicine | Admitting: Family Medicine

## 2015-06-17 ENCOUNTER — Other Ambulatory Visit: Payer: Self-pay | Admitting: Family Medicine

## 2015-06-17 DIAGNOSIS — R109 Unspecified abdominal pain: Secondary | ICD-10-CM

## 2016-06-30 DIAGNOSIS — J0100 Acute maxillary sinusitis, unspecified: Secondary | ICD-10-CM | POA: Diagnosis not present

## 2016-08-10 ENCOUNTER — Other Ambulatory Visit: Payer: Self-pay | Admitting: Family Medicine

## 2016-08-10 ENCOUNTER — Other Ambulatory Visit (HOSPITAL_COMMUNITY)
Admission: RE | Admit: 2016-08-10 | Discharge: 2016-08-10 | Disposition: A | Discharge status: Home / Self Care | Payer: 59 | Source: Ambulatory Visit | Attending: Family Medicine | Admitting: Family Medicine

## 2016-08-10 DIAGNOSIS — Z0000 Encounter for general adult medical examination without abnormal findings: Secondary | ICD-10-CM | POA: Diagnosis not present

## 2016-08-10 DIAGNOSIS — Z01411 Encounter for gynecological examination (general) (routine) with abnormal findings: Secondary | ICD-10-CM | POA: Diagnosis not present

## 2016-08-10 DIAGNOSIS — Z13220 Encounter for screening for lipoid disorders: Secondary | ICD-10-CM | POA: Diagnosis not present

## 2016-08-14 LAB — CYTOLOGY - PAP
ADEQUACY: ABSENT
Diagnosis: NEGATIVE

## 2017-02-08 DIAGNOSIS — M62838 Other muscle spasm: Secondary | ICD-10-CM | POA: Diagnosis not present

## 2017-04-02 DIAGNOSIS — R030 Elevated blood-pressure reading, without diagnosis of hypertension: Secondary | ICD-10-CM | POA: Diagnosis not present

## 2017-04-18 DIAGNOSIS — J069 Acute upper respiratory infection, unspecified: Secondary | ICD-10-CM | POA: Diagnosis not present

## 2017-04-18 DIAGNOSIS — M791 Myalgia, unspecified site: Secondary | ICD-10-CM | POA: Diagnosis not present

## 2017-05-01 ENCOUNTER — Other Ambulatory Visit: Payer: Self-pay

## 2017-05-01 ENCOUNTER — Emergency Department (HOSPITAL_BASED_OUTPATIENT_CLINIC_OR_DEPARTMENT_OTHER)
Admission: EM | Admit: 2017-05-01 | Discharge: 2017-05-01 | Disposition: A | Discharge status: Home / Self Care | Payer: 59 | Attending: Emergency Medicine | Admitting: Emergency Medicine

## 2017-05-01 ENCOUNTER — Emergency Department (HOSPITAL_BASED_OUTPATIENT_CLINIC_OR_DEPARTMENT_OTHER): Payer: 59

## 2017-05-01 ENCOUNTER — Encounter (HOSPITAL_BASED_OUTPATIENT_CLINIC_OR_DEPARTMENT_OTHER): Payer: Self-pay

## 2017-05-01 DIAGNOSIS — I10 Essential (primary) hypertension: Secondary | ICD-10-CM | POA: Diagnosis not present

## 2017-05-01 DIAGNOSIS — R0602 Shortness of breath: Secondary | ICD-10-CM | POA: Diagnosis not present

## 2017-05-01 DIAGNOSIS — R000 Tachycardia, unspecified: Secondary | ICD-10-CM | POA: Diagnosis not present

## 2017-05-01 DIAGNOSIS — R002 Palpitations: Secondary | ICD-10-CM | POA: Diagnosis not present

## 2017-05-01 DIAGNOSIS — Z79899 Other long term (current) drug therapy: Secondary | ICD-10-CM | POA: Insufficient documentation

## 2017-05-01 DIAGNOSIS — Z87891 Personal history of nicotine dependence: Secondary | ICD-10-CM | POA: Diagnosis not present

## 2017-05-01 LAB — COMPREHENSIVE METABOLIC PANEL
ALT: 17 U/L (ref 14–54)
ANION GAP: 13 (ref 5–15)
AST: 21 U/L (ref 15–41)
Albumin: 4 g/dL (ref 3.5–5.0)
Alkaline Phosphatase: 30 U/L — ABNORMAL LOW (ref 38–126)
BUN: 9 mg/dL (ref 6–20)
CALCIUM: 8.8 mg/dL — AB (ref 8.9–10.3)
CHLORIDE: 101 mmol/L (ref 101–111)
CO2: 21 mmol/L — AB (ref 22–32)
Creatinine, Ser: 0.67 mg/dL (ref 0.44–1.00)
GFR calc Af Amer: >60 mL/min (ref >60)
Glucose, Bld: 99 mg/dL (ref 65–99)
Potassium: 3.6 mmol/L (ref 3.5–5.1)
SODIUM: 135 mmol/L (ref 135–145)
Total Bilirubin: 0.7 mg/dL (ref 0.3–1.2)
Total Protein: 7.4 g/dL (ref 6.5–8.1)

## 2017-05-01 LAB — CBC WITH DIFFERENTIAL/PLATELET
Basophils Absolute: 0 10*3/uL (ref 0.0–0.1)
Basophils Relative: 0 %
EOS ABS: 0.1 10*3/uL (ref 0.0–0.7)
EOS PCT: 2 %
HCT: 35.1 % — ABNORMAL LOW (ref 36.0–46.0)
Hemoglobin: 11.3 g/dL — ABNORMAL LOW (ref 12.0–15.0)
Lymphocytes Relative: 24 %
Lymphs Abs: 1.3 10*3/uL (ref 0.7–4.0)
MCH: 27.2 pg (ref 26.0–34.0)
MCHC: 32.2 g/dL (ref 30.0–36.0)
MCV: 84.6 fL (ref 78.0–100.0)
MONO ABS: 0.4 10*3/uL (ref 0.1–1.0)
MONOS PCT: 7 %
Neutro Abs: 3.7 10*3/uL (ref 1.7–7.7)
Neutrophils Relative %: 67 %
PLATELETS: 257 10*3/uL (ref 150–400)
RBC: 4.15 MIL/uL (ref 3.87–5.11)
RDW: 14.7 % (ref 11.5–15.5)
WBC: 5.4 10*3/uL (ref 4.0–10.5)

## 2017-05-01 LAB — PREGNANCY, URINE: PREG TEST UR: NEGATIVE

## 2017-05-01 LAB — D-DIMER, QUANTITATIVE: D-Dimer, Quant: 0.46 ug/mL-FEU (ref 0.00–0.50)

## 2017-05-01 LAB — MAGNESIUM: MAGNESIUM: 1.7 mg/dL (ref 1.7–2.4)

## 2017-05-01 MED ORDER — SODIUM CHLORIDE 0.9 % IV BOLUS (SEPSIS)
1000.0000 mL | Freq: Once | INTRAVENOUS | Status: AC
  Administered 2017-05-01: 1000 mL via INTRAVENOUS

## 2017-05-01 NOTE — ED Provider Notes (Signed)
[facility] EMERGENCY DEPARTMENT Provider Note  CSN: [id number] Arrival date & time: 05/01/17 0258  Chief Complaint(s) Tachycardia and Palpitations  HPI Cassie Kim is a 42 y.o. female with a history of PACs/PVCs with intermittent palpitations who presents to the emergency department with persistent palpitations that began approximately 1 hour prior to arrival.  Patient states that she felt her heart skip and then noted that it began racing.  States that she can typically perform breathing exercises and resolve them however she was unable to do so today.  She noted that her heart rate was in the 130s.  She is endorsing pressure in her throat and mild shortness of breath. Denies any associated chest pain. Patient denies any associated stress or anxiety.  She reports recent URI symptoms over the last 1 to 2 weeks however this has resolved and she has not taken any over-the-counter decongestants.  She denies any history of PE/DVT, clotting disorders, personal history of cancer, recent travel or surgery, OCPs.  She denies any leg swelling or pain.  Last menstrual period was February 27.  States that prior thyroid studies have been within normal limits, last of which was 1 year ago.  States that she tries to stay hydrated.  Denies any caffeine use.  Denies any alcohol consumption, cigarette smoking, illicit drug use.  HPI  Past Medical History Past Medical History:  Diagnosis Date  . Anxiety    Was on Zoloft & Ativan pre-pregnancy prn  . Childhood asthma   . Constipation   . Dysrhythmia    h/o palpitations with work-up in 2011 [facility] cardiology  . GERD (gastroesophageal reflux disease)    Takes Zantac  . Headache(784.0)   . Hypertension    Patient Active Problem List   Diagnosis Date Noted  . Obesity (BMI 30-39.9) 03/28/2013  . Murmur, cardiac 03/28/2013  . Other malaise and fatigue 03/28/2013  . Fasciculations 02/10/2013   Home Medication(s) Prior to Admission medications     Medication Sig Start Date End Date Taking? Authorizing Provider  LORazepam (ATIVAN) 0.5 MG tablet Take 1 tablet (0.5 mg total) by mouth every 8 (eight) hours. 03/28/13   Ann Held, DO  Multiple Vitamins-Iron (MULTIVITAMINS WITH IRON) TABS tablet Take 1 tablet by mouth daily.    [provider]  ranitidine (ZANTAC) 150 MG tablet Take 150 mg by mouth 2 (two) times daily. As needed    [provider]  sertraline (ZOLOFT) 50 MG tablet TAKE 1 TABLET EVERY DAY 10/03/13   Ann Held, DO                                                                                                                                    Past Surgical History Past Surgical History:  Procedure Laterality Date  . CESAREAN SECTION    . CESAREAN SECTION N/A 05/29/2012   Procedure: CESAREAN SECTION;  Surgeon: Allyn Kenner, DO;  Location: [facility];  Service: Obstetrics;  Laterality: N/A;   Family History History reviewed. No pertinent family history.  Social History Social History   Tobacco Use  . Smoking status: Former Smoker    Last attempt to quit: 09/13/2000    Years since quitting: 16.6  . Smokeless tobacco: Never Used  Substance Use Topics  . Alcohol use: Yes    Comment: rarely  . Drug use: No   Allergies Patient has no known allergies.  Review of Systems Review of Systems All other systems are reviewed and are negative for acute change except as noted in the HPI  Physical Exam Vital Signs  I have reviewed the triage vital signs BP (!) 157/74 (BP Location: Right Arm)   Pulse (!) 105   Temp 98.3 F (36.8 C) (Oral)   Resp 18   LMP 04/11/2017   SpO2 100%   Physical Exam  Constitutional: She is oriented to person, place, and time. She appears well-developed and well-nourished. No distress.  HENT:  Head: Normocephalic and atraumatic.  Nose: Nose normal.  Eyes: Conjunctivae and EOM are normal. Pupils are equal, round, and reactive to light. Right eye exhibits no  discharge. Left eye exhibits no discharge. No scleral icterus.  Neck: Normal range of motion. Neck supple.  Cardiovascular: Regular rhythm. Tachycardia present. Exam reveals no gallop and no friction rub.  No murmur heard. Pulmonary/Chest: Effort normal and breath sounds normal. No stridor. No respiratory distress. She has no rales.  Abdominal: Soft. She exhibits no distension. There is no tenderness.  Musculoskeletal: She exhibits no edema or tenderness.  Neurological: She is alert and oriented to person, place, and time.  Skin: Skin is warm and dry. No rash noted. She is not diaphoretic. No erythema.  Psychiatric: She has a normal mood and affect.  Vitals reviewed.   ED Results and Treatments Labs (all labs ordered are listed, but only abnormal results are displayed) Labs Reviewed  CBC WITH DIFFERENTIAL/PLATELET - Abnormal; Notable for the following components:      Result Value   Hemoglobin 11.3 (*)    HCT 35.1 (*)    All other components within normal limits  COMPREHENSIVE METABOLIC PANEL - Abnormal; Notable for the following components:   CO2 21 (*)    Calcium 8.8 (*)    Alkaline Phosphatase 30 (*)    All other components within normal limits  MAGNESIUM  D-DIMER, QUANTITATIVE (NOT AT [facility])  PREGNANCY, URINE                                                                                                                         EKG  EKG Interpretation  Date/Time:  Tuesday May 01 2017 07:06:11 EDT Ventricular Rate:  109 PR Interval:    QRS Duration: 90 QT Interval:  299 QTC Calculation: 403 R Axis:   68 Text Interpretation:  Sinus tachycardia Consider left atrial enlargement Probable LVH with secondary repol abnrm No significant change since last tracing Confirmed by  Addison Lank [phone]) on 05/01/2017 7:36:29 AM      Radiology Dg Chest 2 View  Result Date: 05/01/2017 CLINICAL DATA:  Palpitations. EXAM: CHEST - 2 VIEW COMPARISON:  CT chest dated December 22, 2007.  FINDINGS: The heart size and mediastinal contours are within normal limits. Both lungs are clear. The visualized skeletal structures are unremarkable. IMPRESSION: Normal chest x-ray. Electronically Signed   By: Titus Dubin M.D.   On: 05/01/2017 07:36   Pertinent labs & imaging results that were available during my care of the patient were reviewed by me and considered in my medical decision making (see chart for details).  Medications Ordered in ED Medications  sodium chloride 0.9 % bolus 1,000 mL (0 mLs Intravenous Stopped 05/01/17 0856)                                                                                                                                    Procedures Procedures  (including critical care time)  Medical Decision Making / ED Course I have reviewed the nursing notes for this encounter and the patient's prior records (if available in EHR or on provided paperwork).     EKG was normal sinus rhythm, tachycardic.  Otherwise no acute changes from prior.  Patient's heart rate slowly improved during my evaluation from low 100s to the 80s.  We will obtain screening labs to assess for any electrolyte derangements.  UPT to assess for pregnancy.  D-dimer for possible PE.  Provided with IV fluids for possible dehydration.  Labs grossly reassuring without anemia, significant electrolyte derangements, renal insufficiency.  UPT negative.  D-dimer negative.  Tachycardia resolved and patient's heart rate now in the 70s.  The patient appears reasonably screened and/or stabilized for discharge and I doubt any other medical condition or other [facility] requiring further screening, evaluation, or treatment in the ED at this time prior to discharge.  The patient is safe for discharge with strict return precautions.   Final Clinical Impression(s) / ED Diagnoses Final diagnoses:  SOB (shortness of breath)  Sinus tachycardia  Palpitations   Disposition: Discharge  Condition:  Good  I have discussed the results, Dx and Tx plan with the patient who expressed understanding and agree(s) with the plan. Discharge instructions discussed at great length. The patient was given strict return precautions who verbalized understanding of the instructions. No further questions at time of discharge.    ED Discharge Orders    None       Follow Up: Ann Held, DO [address] [place] [address] [phone]  Schedule an appointment as soon as possible for a visit  As needed  Cardiology  Schedule an appointment as soon as possible for a visit  As needed      This chart was dictated using voice recognition software.  Despite best efforts to proofread,  errors can occur which can change the documentation  meaning.   Fatima Blank, MD 05/01/17 [phone]

## 2017-05-01 NOTE — ED Triage Notes (Signed)
Pt reports feeling like "heart is racing" and palpitations while taking a shower this AM. Pt denies taking stimulants within the last 24hrs. Pt A+OX4, NAD.

## 2017-05-02 DIAGNOSIS — R002 Palpitations: Secondary | ICD-10-CM | POA: Diagnosis not present

## 2017-05-08 DIAGNOSIS — R002 Palpitations: Secondary | ICD-10-CM | POA: Diagnosis not present

## 2017-05-14 DIAGNOSIS — R008 Other abnormalities of heart beat: Secondary | ICD-10-CM | POA: Diagnosis not present

## 2017-06-14 DIAGNOSIS — R008 Other abnormalities of heart beat: Secondary | ICD-10-CM | POA: Diagnosis not present

## 2017-07-06 DIAGNOSIS — R946 Abnormal results of thyroid function studies: Secondary | ICD-10-CM | POA: Diagnosis not present

## 2017-07-26 DIAGNOSIS — Z3201 Encounter for pregnancy test, result positive: Secondary | ICD-10-CM | POA: Diagnosis not present

## 2017-07-26 DIAGNOSIS — Z3490 Encounter for supervision of normal pregnancy, unspecified, unspecified trimester: Secondary | ICD-10-CM | POA: Diagnosis not present

## 2017-07-30 DIAGNOSIS — Z3687 Encounter for antenatal screening for uncertain dates: Secondary | ICD-10-CM | POA: Diagnosis not present

## 2017-08-09 DIAGNOSIS — Z3201 Encounter for pregnancy test, result positive: Secondary | ICD-10-CM | POA: Diagnosis not present

## 2017-08-09 DIAGNOSIS — N912 Amenorrhea, unspecified: Secondary | ICD-10-CM | POA: Diagnosis not present

## 2017-08-10 DIAGNOSIS — Z3201 Encounter for pregnancy test, result positive: Secondary | ICD-10-CM | POA: Diagnosis not present

## 2017-08-22 DIAGNOSIS — Z3201 Encounter for pregnancy test, result positive: Secondary | ICD-10-CM | POA: Diagnosis not present

## 2017-08-22 DIAGNOSIS — O020 Blighted ovum and nonhydatidiform mole: Secondary | ICD-10-CM | POA: Diagnosis not present

## 2017-09-04 DIAGNOSIS — O021 Missed abortion: Secondary | ICD-10-CM | POA: Diagnosis not present

## 2017-09-04 DIAGNOSIS — R112 Nausea with vomiting, unspecified: Secondary | ICD-10-CM | POA: Diagnosis not present

## 2017-09-04 DIAGNOSIS — O3680X0 Pregnancy with inconclusive fetal viability, not applicable or unspecified: Secondary | ICD-10-CM | POA: Diagnosis not present

## 2017-09-04 DIAGNOSIS — O200 Threatened abortion: Secondary | ICD-10-CM | POA: Diagnosis not present

## 2017-09-12 ENCOUNTER — Other Ambulatory Visit: Payer: Self-pay | Admitting: Family Medicine

## 2017-09-12 ENCOUNTER — Other Ambulatory Visit (HOSPITAL_COMMUNITY)
Admission: RE | Admit: 2017-09-12 | Discharge: 2017-09-12 | Disposition: A | Discharge status: Home / Self Care | Payer: 59 | Source: Ambulatory Visit | Attending: Family Medicine | Admitting: Family Medicine

## 2017-09-12 DIAGNOSIS — Z0000 Encounter for general adult medical examination without abnormal findings: Secondary | ICD-10-CM | POA: Diagnosis not present

## 2017-09-12 DIAGNOSIS — Z13220 Encounter for screening for lipoid disorders: Secondary | ICD-10-CM | POA: Diagnosis not present

## 2017-09-12 DIAGNOSIS — Z01411 Encounter for gynecological examination (general) (routine) with abnormal findings: Secondary | ICD-10-CM | POA: Diagnosis not present

## 2017-09-13 LAB — CYTOLOGY - PAP: DIAGNOSIS: NEGATIVE

## 2017-09-16 DIAGNOSIS — Z79899 Other long term (current) drug therapy: Secondary | ICD-10-CM | POA: Diagnosis not present

## 2017-09-16 DIAGNOSIS — Z3A08 8 weeks gestation of pregnancy: Secondary | ICD-10-CM | POA: Diagnosis not present

## 2017-09-16 DIAGNOSIS — O209 Hemorrhage in early pregnancy, unspecified: Secondary | ICD-10-CM | POA: Diagnosis not present

## 2017-09-16 DIAGNOSIS — Z3A10 10 weeks gestation of pregnancy: Secondary | ICD-10-CM | POA: Diagnosis not present

## 2017-09-16 DIAGNOSIS — O020 Blighted ovum and nonhydatidiform mole: Secondary | ICD-10-CM | POA: Diagnosis not present

## 2017-09-18 DIAGNOSIS — O021 Missed abortion: Secondary | ICD-10-CM | POA: Diagnosis not present

## 2017-09-18 DIAGNOSIS — O034 Incomplete spontaneous abortion without complication: Secondary | ICD-10-CM | POA: Diagnosis not present

## 2017-09-24 DIAGNOSIS — O021 Missed abortion: Secondary | ICD-10-CM | POA: Diagnosis not present

## 2017-10-05 DIAGNOSIS — O021 Missed abortion: Secondary | ICD-10-CM | POA: Diagnosis not present

## 2017-10-19 IMAGING — US US ABDOMEN COMPLETE
1 series · 14 of 25 positions shown · non-contrast
Comparison: None.

CLINICAL DATA: Left upper quadrant abdominal pain

EXAM:
ABDOMEN ULTRASOUND COMPLETE

[Series 1: us abdomen complete · 0.32mm/px · 14 of 71 slices shown]
[im 1/71]
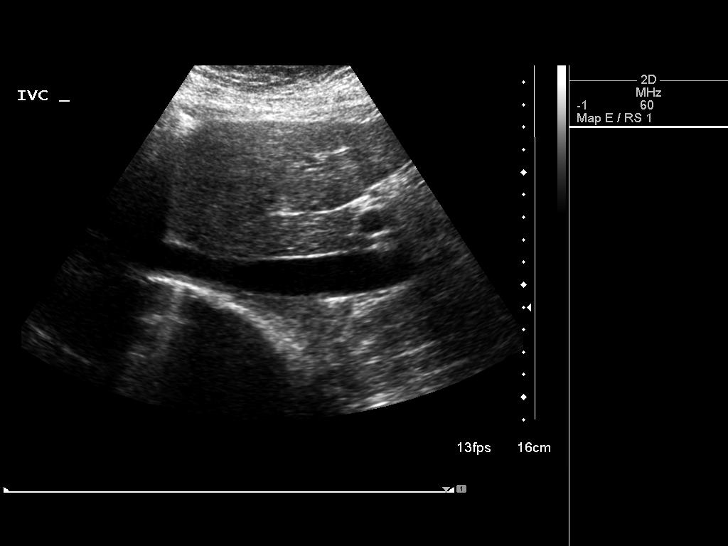
[im 6/71]
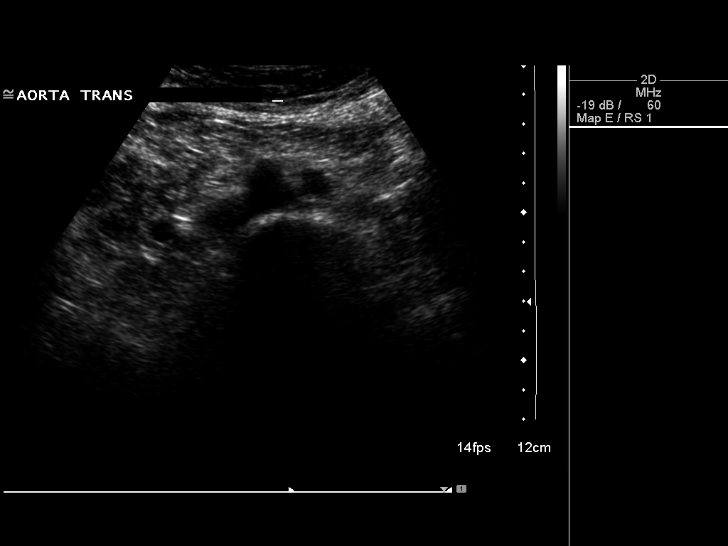
[im 12/71]
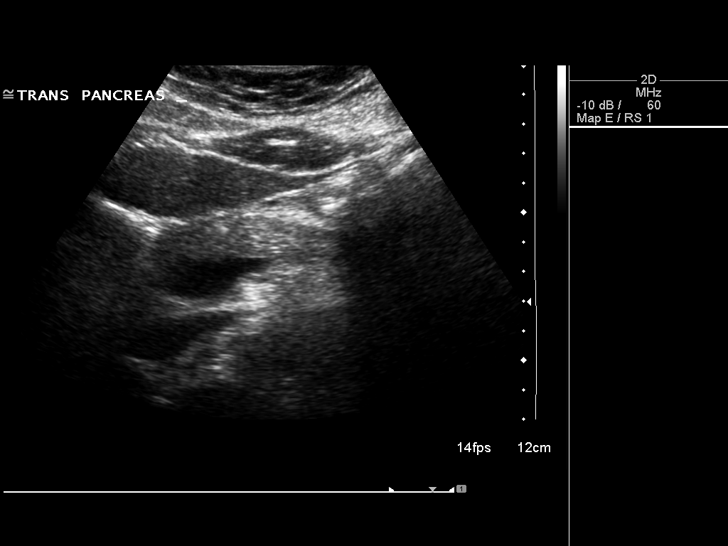
[im 18/71]
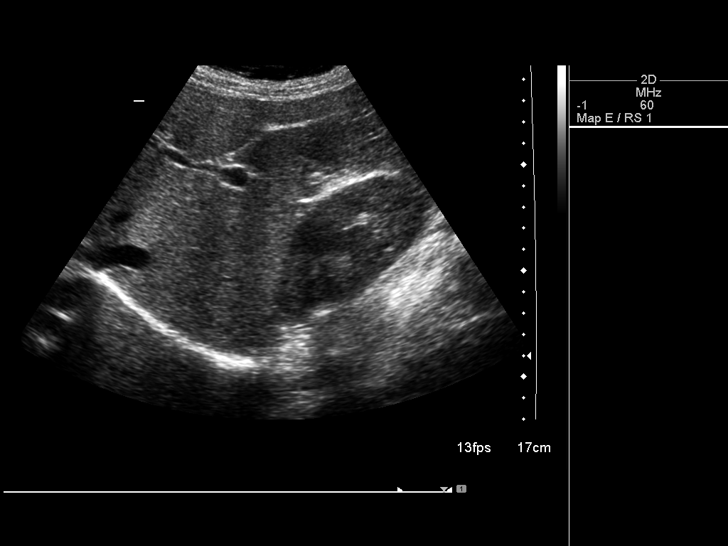
[im 24/71]
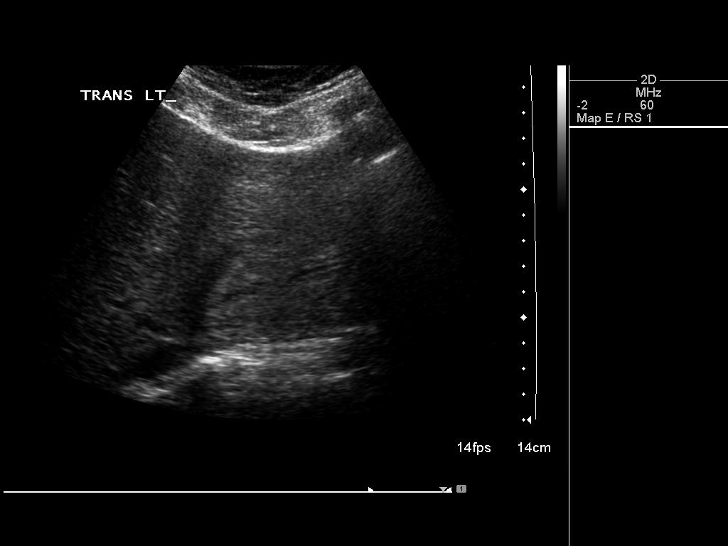
[im 27/71]
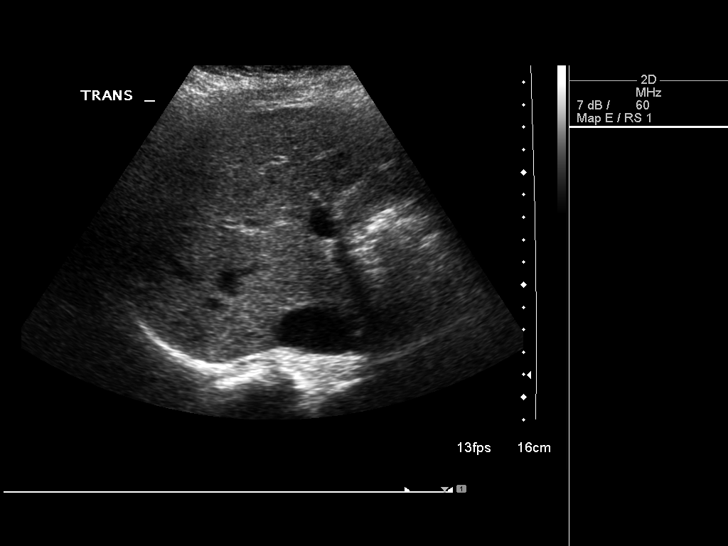
[im 33/71]
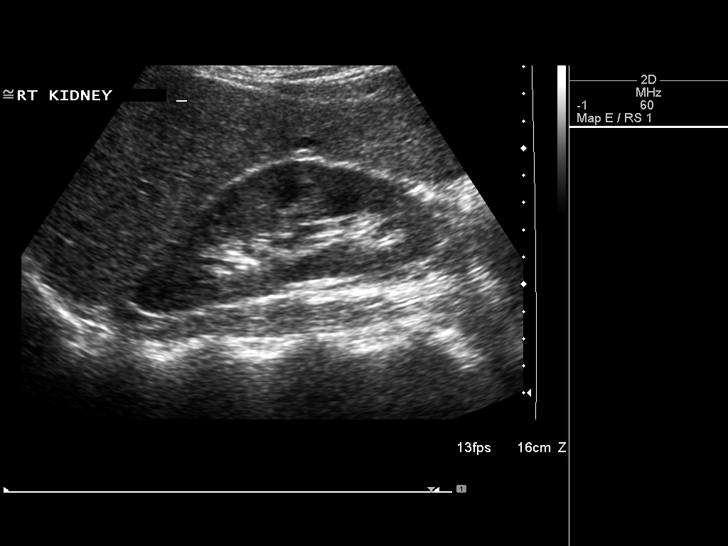
[im 38/71]
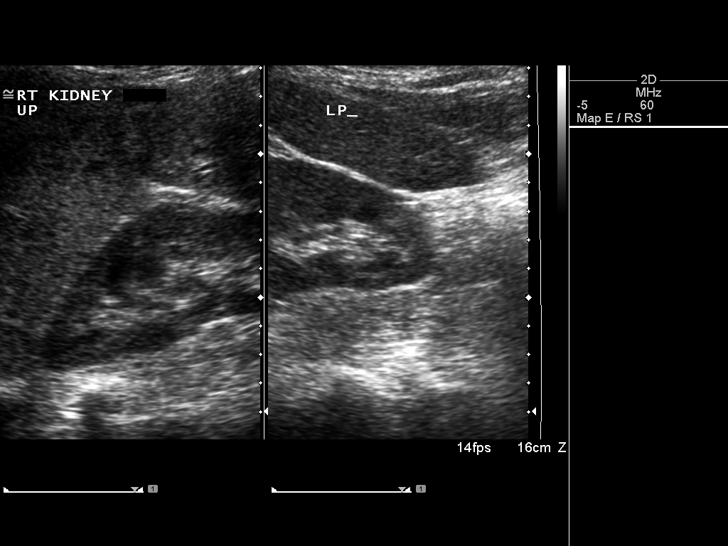
[im 44/71]
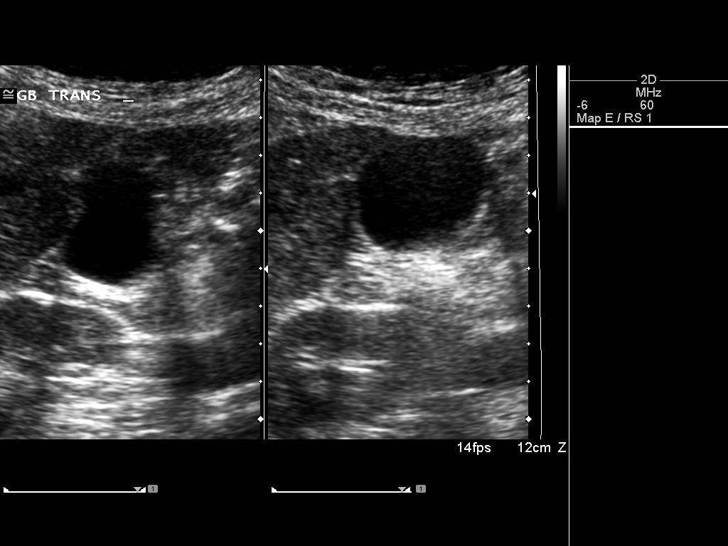
[im 47/71]
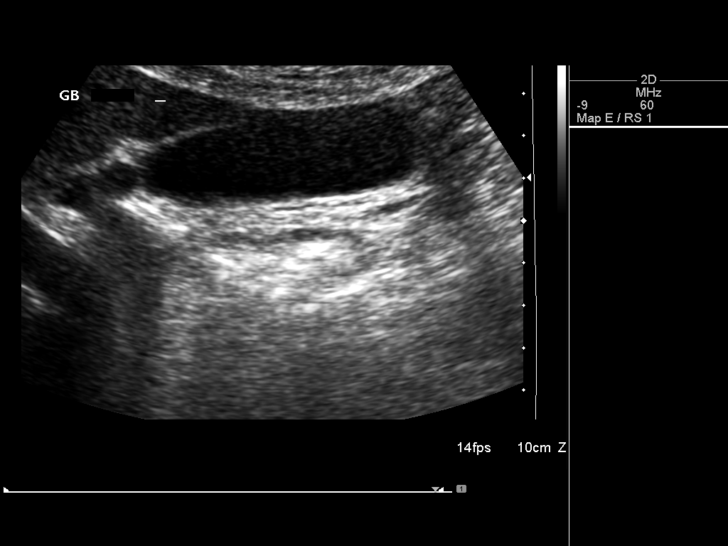
[im 53/71]
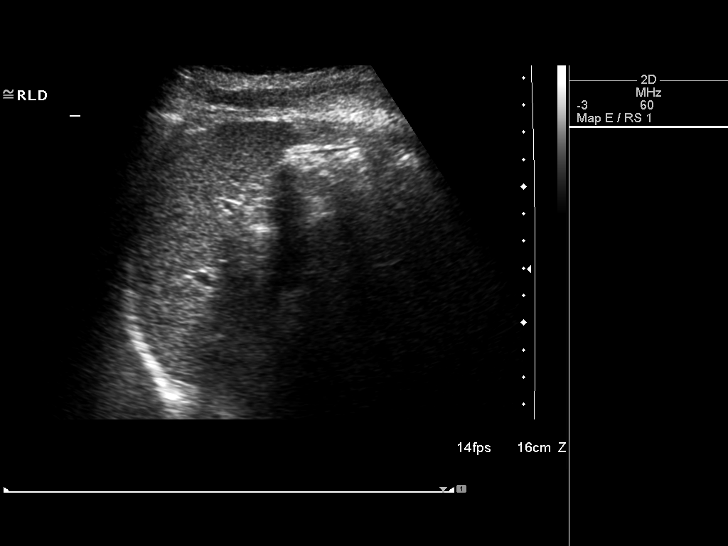
[im 59/71]
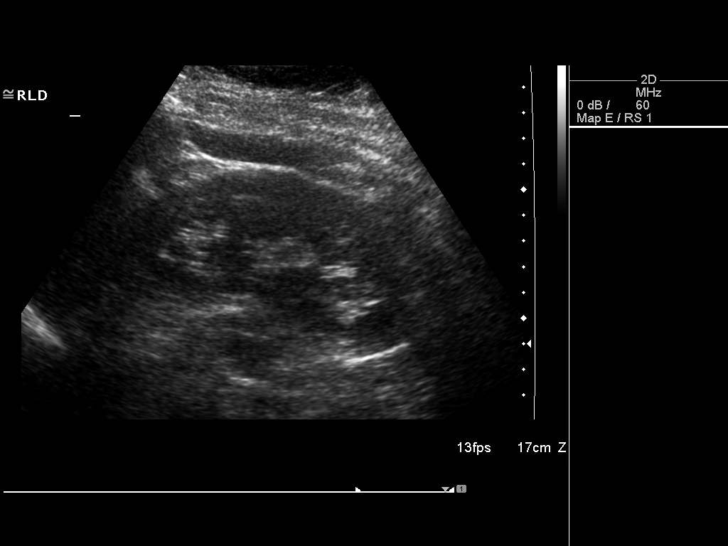
[im 65/71]
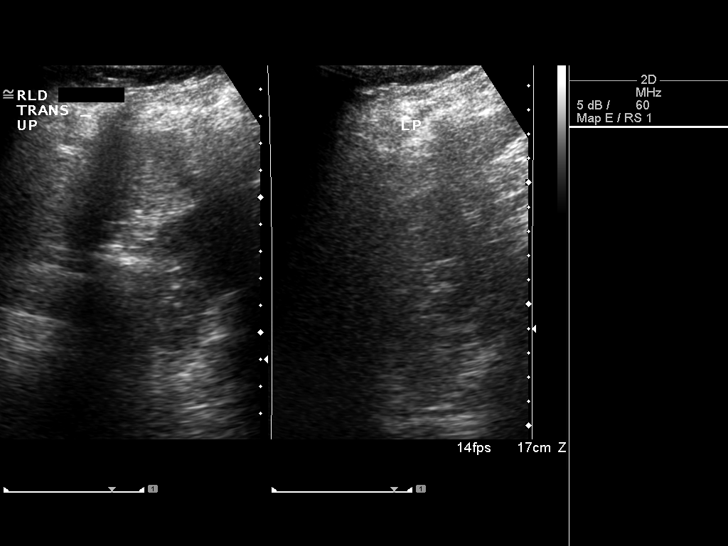
[im 71/71]
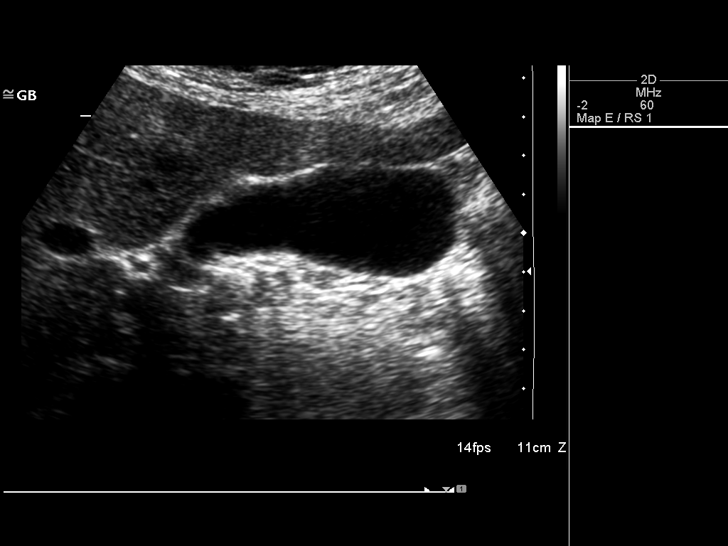

[14 of 25 positions shown; findings below may reference images not displayed]

FINDINGS: Gallbladder: The gallbladder is visualized and no gallstones are
noted. There is no pain over the gallbladder with compression.

Common bile duct: Diameter: The common bile duct is normal measuring
2.7 mm in diameter.

Liver: The liver has a normal echogenic pattern. No focal hepatic
abnormality is seen.

IVC: No abnormality visualized.

Pancreas: Visualized portion unremarkable.

Spleen: The spleen is normal measuring 5.5 cm.

Right Kidney: Length: 11.9 cm..  No hydronephrosis is seen.

Left Kidney: Length: 11.8 cm..  No hydronephrosis is noted.

Abdominal aorta: The abdominal aorta is normal caliber

Other findings: None.
IMPRESSION: Negative ultrasound of the abdomen.

## 2017-11-12 DIAGNOSIS — N3000 Acute cystitis without hematuria: Secondary | ICD-10-CM | POA: Diagnosis not present

## 2017-11-22 DIAGNOSIS — R3915 Urgency of urination: Secondary | ICD-10-CM | POA: Diagnosis not present

## 2017-11-23 DIAGNOSIS — Z0100 Encounter for examination of eyes and vision without abnormal findings: Secondary | ICD-10-CM | POA: Diagnosis not present

## 2017-12-14 DIAGNOSIS — D509 Iron deficiency anemia, unspecified: Secondary | ICD-10-CM | POA: Diagnosis not present

## 2018-01-15 DIAGNOSIS — R350 Frequency of micturition: Secondary | ICD-10-CM | POA: Diagnosis not present

## 2018-01-15 DIAGNOSIS — R1030 Lower abdominal pain, unspecified: Secondary | ICD-10-CM | POA: Diagnosis not present

## 2018-01-21 ENCOUNTER — Telehealth: Payer: Self-pay | Admitting: Cardiology

## 2018-01-21 DIAGNOSIS — R350 Frequency of micturition: Secondary | ICD-10-CM | POA: Diagnosis not present

## 2018-01-21 NOTE — Telephone Encounter (Signed)
Patient has been experiencing heart palpitations in the middle of the night roughly 2 times every week. This wakes her up while sleeping. Patient was last seen by Dr. Wynonia Lawman on 06/14/17 for tachycardia and was told to follow up as needed. Since these are new symptoms, advised patient to schedule appointment and will see Dr. Bettina Gavia tomorrow at 3:20. Patient is scheduled for a sleep consultation on 02/04/18 to discuss sleep apnea. Advised patient to keep this appointment. No further questions.

## 2018-01-21 NOTE — Telephone Encounter (Signed)
Patient is from Dr. Wynonia Lawman and has not been seen by [place] yet. She has a question about sleep apnea and her heart, then will schedule follow-up if needed.

## 2018-01-22 ENCOUNTER — Encounter: Payer: Self-pay | Admitting: Cardiology

## 2018-01-22 ENCOUNTER — Ambulatory Visit (INDEPENDENT_AMBULATORY_CARE_PROVIDER_SITE_OTHER): Payer: 59 | Admitting: Cardiology

## 2018-01-22 DIAGNOSIS — I493 Ventricular premature depolarization: Secondary | ICD-10-CM | POA: Diagnosis not present

## 2018-01-22 NOTE — Patient Instructions (Addendum)
Medication Instructions:  Your physician recommends that you continue on your current medications as directed. Please refer to the Current Medication list given to you today.  If you need a refill on your cardiac medications before your next appointment, please call your pharmacy.   Lab work: None  If you have labs (blood work) drawn today and your tests are completely normal, you will receive your results only by: Marland Kitchen MyChart Message (if you have MyChart) OR . A paper copy in the mail If you have any lab test that is abnormal or we need to change your treatment, we will call you to review the results.  Testing/Procedures: You had an EKG today.  Your physician has recommended that you wear a ZIO patch monitor. ZIO patch monitors are medical devices that record the heart's electrical activity. Doctors most often use these monitors to diagnose arrhythmias. Arrhythmias are problems with the speed or rhythm of the heartbeat. The monitor is a small, portable device. You can wear one while you do your normal daily activities. This is usually used to diagnose what is causing palpitations/syncope (passing out). Wear for 14 days.   Follow-Up: At [facility], you and your health needs are our priority.  As part of our continuing mission to provide you with exceptional heart care, we have created designated Provider Care Teams.  These Care Teams include your primary Cardiologist (physician) and Advanced Practice Providers (APPs -  Physician Assistants and Nurse Practitioners) who all work together to provide you with the care you need, when you need it. You will need a follow up appointment in 3 months.  Please call our office 2 months in advance to schedule this appointment.         [employer] [email or web address]        FDA-cleared, clinical grade mobile EKG monitor: Cassie Kim is the most clinically-validated mobile EKG used by the world's leading cardiac care  medical professionals With Basic service, know instantly if your heart rhythm is normal or if atrial fibrillation is detected, and email the last single EKG recording to yourself or your doctor Premium service, available for purchase through the [employer] app for $9.99 per month or $99 per year, includes unlimited history and storage of your EKG recordings, a monthly EKG summary report to share with your doctor, along with the ability to track your blood pressure, activity and weight Includes one [employer] phone clip FREE SHIPPING: Standard delivery 1-3 business days. Orders placed by 11:00am PST will ship that afternoon. Otherwise, will ship next business day. All orders ship via [employer] from [place], [place]   1. Avoid all over-the-counter antihistamines except Claritin/Loratadine and Zyrtec/Cetrizine. 2. Avoid all combination including cold sinus allergies flu decongestant and sleep medications 3. You can use Robitussin DM Mucinex and Mucinex DM for cough. 4. can use Tylenol aspirin ibuprofen and naproxen but no combinations such as sleep or sinus.

## 2018-01-22 NOTE — Progress Notes (Signed)
Cardiology Office Note:    Date:  01/22/2018   ID:  [name], DOB 1975/10/18, MRN [id number]  PCP:  Carollee Herter, Alferd Apa, DO  Cardiologist:  Shirlee More, MD    Referring MD: Carollee Herter, Alferd Apa, *    ASSESSMENT:    1. Frequent PVCs    PLAN:    In order of problems listed above:  1. He continues to have palpitation especially at nighttime she is not sure that we capture this I gave her the offer of using an adapter for her iPhone which she likes to take a 14-day ZIO monitor see if we can document significant arrhythmia she has a prescription for short acting metoprolol which she can take as needed I reinforced the need to avoid over-the-counter proarrhythmic drugs.  She snores excessively and is scheduled for sleep study that could be provocative   Next appointment: 3 months   Medication Adjustments/Labs and Tests Ordered: Current medicines are reviewed at length with the patient today.  Concerns regarding medicines are outlined above.  Orders Placed This Encounter  Procedures  . LONG TERM MONITOR (3-14 DAYS)  . EKG 12-Lead   No orders of the defined types were placed in this encounter.   No chief complaint on file.   History of Present Illness:    Cassie Kim is a 42 y.o. female with a hx of symptomatic PVCs last seen by Dr. Wynonia Lawman 06/14/2017. An  echocardiogram has been performed that was normal except for mild mitral regurgitation.. Compliance with diet, lifestyle and medications: Yes  She is having a flare of nighttime symptoms where she feels her heart races but has not captured this in any type of 40.  She remains unsettled that she has an arrhythmia unrecognized the symptoms tend to cluster at night she snores and is scheduled for sleep study.  I gave her the offered going on a beta-blocker to alleviate PVCs she declines she has short acting metoprolol to take as needed and after discussion she will utilize a 14-day ZIO monitor to see if we can assess and  find a sustained arrhythmia.  She is not having daytime symptoms chest pain shortness of breath or syncope she also is avoiding over-the-counter proarrhythmic drugs and has stopped taking Zoloft Past Medical History:  Diagnosis Date  . Anxiety    Was on Zoloft & Ativan pre-pregnancy prn  . Childhood asthma   . Constipation   . Dysrhythmia    h/o palpitations with work-up in 2011 [facility] cardiology  . GERD (gastroesophageal reflux disease)    Takes Zantac  . Headache(784.0)   . Hypertension     Past Surgical History:  Procedure Laterality Date  . CESAREAN SECTION    . CESAREAN SECTION N/A 05/29/2012   Procedure: CESAREAN SECTION;  Surgeon: Allyn Kenner, DO;  Location: [facility];  Service: Obstetrics;  Laterality: N/A;    Current Medications: Current Meds  Medication Sig  . LORazepam (ATIVAN) 0.5 MG tablet Take 1 tablet (0.5 mg total) by mouth every 8 (eight) hours. (Patient taking differently: Take 0.5 mg by mouth every 8 (eight) hours as needed. )  . omeprazole (PRILOSEC) 40 MG capsule TAKE ONE CAPSULE BY MOUTH EVERY DAY  . Probiotic Product (PROBIOTIC DAILY PO) Take 1 tablet by mouth daily.  . vitamin C (ASCORBIC ACID) 500 MG tablet Take 500 mg by mouth daily.     Allergies:   Patient has no known allergies.   Social History   Socioeconomic History  .  Marital status: Married    Spouse name: [name]  . Number of children: 6  . Years of education: college  . Highest education level: Not on file  Occupational History  . Not on file  Social Needs  . Financial resource strain: Not on file  . Food insecurity:    Worry: Not on file    Inability: Not on file  . Transportation needs:    Medical: Not on file    Non-medical: Not on file  Tobacco Use  . Smoking status: Former Smoker    Last attempt to quit: 09/13/2000    Years since quitting: 17.3  . Smokeless tobacco: Never Used  Substance and Sexual Activity  . Alcohol use: Yes    Comment: rarely  . Drug use: No  . Sexual  activity: Not on file  Lifestyle  . Physical activity:    Days per week: Not on file    Minutes per session: Not on file  . Stress: Not on file  Relationships  . Social connections:    Talks on phone: Not on file    Gets together: Not on file    Attends religious service: Not on file    Active member of club or organization: Not on file    Attends meetings of clubs or organizations: Not on file    Relationship status: Not on file  Other Topics Concern  . Not on file  Social History Narrative   Patient is married Technical brewer).   Patient is a homemaker, previously a Chief Technology Officer.   Patient has 6 children (4 biological and 2 step-children).   Patient rarely drinks caffeine.           Family History: The patient's family history includes Hypertension in her maternal grandmother and paternal grandmother. ROS:   Please see the history of present illness.    All other systems reviewed and are negative.  EKGs/Labs/Other Studies Reviewed:    The following studies were reviewed today: EKG today sinus rhythm and is normal   Recent Labs: 05/01/2017: ALT 17; BUN 9; Creatinine, Ser 0.67; Hemoglobin 11.3; Magnesium 1.7; Platelets 257; Potassium 3.6; Sodium 135  Recent Lipid Panel No results found for: CHOL, TRIG, HDL, CHOLHDL, VLDL, LDLCALC, LDLDIRECT  Physical Exam:    VS:  BP 124/88 (BP Location: Right Arm, Patient Position: Sitting, Cuff Size: Large)   Pulse 75   Ht 5\' 7"  (1.702 m)   Wt 208 lb (94.3 kg)   SpO2 99%   BMI 32.58 kg/m     Wt Readings from Last 3 Encounters:  01/22/18 208 lb (94.3 kg)  04/24/13 201 lb (91.2 kg)  03/28/13 199 lb (90.3 kg)     GEN:  Well nourished, well developed in no acute distress HEENT: Normal NECK: No JVD; No carotid bruits LYMPHATICS: No lymphadenopathy CARDIAC: RRR, no murmurs, rubs, gallops RESPIRATORY:  Clear to auscultation without rales, wheezing or rhonchi  ABDOMEN: Soft, non-tender, non-distended MUSCULOSKELETAL:  No  edema; No deformity  SKIN: Warm and dry NEUROLOGIC:  Alert and oriented x 3 PSYCHIATRIC:  Normal affect    Signed, Shirlee More, MD  01/22/2018 5:06 PM    [facility]

## 2018-01-23 DIAGNOSIS — R140 Abdominal distension (gaseous): Secondary | ICD-10-CM | POA: Diagnosis not present

## 2018-01-23 DIAGNOSIS — R1013 Epigastric pain: Secondary | ICD-10-CM | POA: Diagnosis not present

## 2018-01-23 DIAGNOSIS — K219 Gastro-esophageal reflux disease without esophagitis: Secondary | ICD-10-CM | POA: Diagnosis not present

## 2018-01-24 ENCOUNTER — Ambulatory Visit: Payer: 59

## 2018-01-24 DIAGNOSIS — I493 Ventricular premature depolarization: Secondary | ICD-10-CM | POA: Diagnosis not present

## 2018-01-25 DIAGNOSIS — R1013 Epigastric pain: Secondary | ICD-10-CM | POA: Diagnosis not present

## 2018-01-28 ENCOUNTER — Telehealth: Payer: Self-pay | Admitting: Cardiology

## 2018-01-28 NOTE — Telephone Encounter (Signed)
Patient having itching and burning where tape is on monitor. Due to take off 12/26. Can she take off now?

## 2018-02-01 DIAGNOSIS — M6281 Muscle weakness (generalized): Secondary | ICD-10-CM | POA: Diagnosis not present

## 2018-02-01 DIAGNOSIS — R350 Frequency of micturition: Secondary | ICD-10-CM | POA: Diagnosis not present

## 2018-02-01 DIAGNOSIS — M62838 Other muscle spasm: Secondary | ICD-10-CM | POA: Diagnosis not present

## 2018-02-04 DIAGNOSIS — G478 Other sleep disorders: Secondary | ICD-10-CM | POA: Diagnosis not present

## 2018-02-04 DIAGNOSIS — R0683 Snoring: Secondary | ICD-10-CM | POA: Diagnosis not present

## 2018-02-08 ENCOUNTER — Telehealth: Payer: Self-pay | Admitting: Cardiology

## 2018-02-08 NOTE — Telephone Encounter (Signed)
Patient informed that her ZIO monitor results are not available yet. Advised her we would contact her with the results as soon as possible. Patient verbalized understanding. No further questions.

## 2018-02-11 ENCOUNTER — Telehealth: Payer: Self-pay | Admitting: *Deleted

## 2018-02-11 MED ORDER — METOPROLOL TARTRATE 50 MG PO TABS: 50.0000 mg | ORAL_TABLET | Freq: Every day | ORAL | 3 refills | Status: DC | PRN

## 2018-02-11 NOTE — Telephone Encounter (Signed)
See most recent encounter.  

## 2018-02-11 NOTE — Telephone Encounter (Signed)
Patient informed of monitor results and advised that Dr. Bettina Gavia recommends her take acebutolol daily to minimize her symptoms associated with PVCs. Patient reports that she already has metoprolol tartrate 50 mg daily as needed prescribed by Dr. Wynonia Lawman. Patient has never taken this medication as she prefers not to. Patient denies wanting to try acebutolol at this time. Patient advised to contact our office with any further questions or concerns. Patient verbalized understanding. No further questions.

## 2018-02-11 NOTE — Telephone Encounter (Signed)
-----   Message from Richardo Priest, MD sent at 02/08/2018  2:10 PM EST ----- Normal or stable result   Although the frequency is not high she senses PVCs.  I think she should take a beta-blocker and if the symptoms are being bothersome with initiate treatment with Sectral 200 mg twice daily, she can take it once daily if she would like #60 refill 2.  Please apologize for delay and thank her for calling the office to trigger evaluating the test

## 2018-02-26 DIAGNOSIS — D509 Iron deficiency anemia, unspecified: Secondary | ICD-10-CM | POA: Diagnosis not present

## 2018-04-15 NOTE — Telephone Encounter (Signed)
error 

## 2018-04-24 ENCOUNTER — Ambulatory Visit: Payer: 59 | Admitting: Cardiology

## 2018-04-29 NOTE — Progress Notes (Deleted)
Cardiology Office Note:    Date:  04/29/2018   ID:  Cassie Kim, DOB 28-Aug-1975, MRN [id number]  PCP:  Cassie Austin, MD (Inactive)  Cardiologist:  Cassie More, MD    Referring MD: Cassie Kim, Cassie Kim, *    ASSESSMENT:    No diagnosis found. PLAN:    In order of problems listed above:  1. ***   Next appointment: ***   Medication Adjustments/Labs and Tests Ordered: Current medicines are reviewed at length with the patient today.  Concerns regarding medicines are outlined above.  No orders of the defined types were placed in this encounter.  No orders of the defined types were placed in this encounter.   No chief complaint on file.   History of Present Illness:    Cassie Kim is a 43 y.o. female with a hx of symptomatic PVCs last seen 01/22/2018.  Subsequent 14-day event monitor showed infrequent but symptomatic PVCs and she declined maintenance beta-blocker therapy Compliance with diet, lifestyle and medications: *** Past Medical History:  Diagnosis Date  . Anxiety    Was on Zoloft & Ativan pre-pregnancy prn  . Childhood asthma   . Constipation   . Dysrhythmia    h/o palpitations with work-up in 2011 [facility] cardiology  . GERD (gastroesophageal reflux disease)    Takes Zantac  . Headache(784.0)   . Hypertension     Past Surgical History:  Procedure Laterality Date  . CESAREAN SECTION    . CESAREAN SECTION N/A 05/29/2012   Procedure: CESAREAN SECTION;  Surgeon: Cassie Kenner, DO;  Location: [facility];  Service: Obstetrics;  Laterality: N/A;    Current Medications: No outpatient medications have been marked as taking for the 04/30/18 encounter (Appointment) with Richardo Priest, MD.     Allergies:   Patient has no known allergies.   Social History   Socioeconomic History  . Marital status: Married    Spouse name: [name]  . Number of children: 6  . Years of education: college  . Highest education level: Not on file  Occupational History  . Not on  file  Social Needs  . Financial resource strain: Not on file  . Food insecurity:    Worry: Not on file    Inability: Not on file  . Transportation needs:    Medical: Not on file    Non-medical: Not on file  Tobacco Use  . Smoking status: Former Smoker    Last attempt to quit: 09/13/2000    Years since quitting: 17.6  . Smokeless tobacco: Never Used  Substance and Sexual Activity  . Alcohol use: Yes    Comment: rarely  . Drug use: No  . Sexual activity: Not on file  Lifestyle  . Physical activity:    Days per week: Not on file    Minutes per session: Not on file  . Stress: Not on file  Relationships  . Social connections:    Talks on phone: Not on file    Gets together: Not on file    Attends religious service: Not on file    Active member of club or organization: Not on file    Attends meetings of clubs or organizations: Not on file    Relationship status: Not on file  Other Topics Concern  . Not on file  Social History Narrative   Patient is married Technical brewer).   Patient is a homemaker, previously a Chief Technology Officer.   Patient has 6 children (4 biological and 2 step-children).  Patient rarely drinks caffeine.           Family History: The patient's ***family history includes Hypertension in her maternal grandmother and paternal grandmother. ROS:   Please see the history of present illness.    All other systems reviewed and are negative.  EKGs/Labs/Other Studies Reviewed:    The following studies were reviewed today:  EKG:  EKG ordered today and personally reviewed.  The ekg ordered today demonstrates ***  Recent Labs: 05/01/2017: ALT 17; BUN 9; Creatinine, Ser 0.67; Hemoglobin 11.3; Magnesium 1.7; Platelets 257; Potassium 3.6; Sodium 135  Recent Lipid Panel No results found for: CHOL, TRIG, HDL, CHOLHDL, VLDL, LDLCALC, LDLDIRECT  Physical Exam:    VS:  There were no vitals taken for this visit.    Wt Readings from Last 3 Encounters:  01/22/18 208  lb (94.3 kg)  04/24/13 201 lb (91.2 kg)  03/28/13 199 lb (90.3 kg)     GEN: *** Well nourished, well developed in no acute distress HEENT: Normal NECK: No JVD; No carotid bruits LYMPHATICS: No lymphadenopathy CARDIAC: ***RRR, no murmurs, rubs, gallops RESPIRATORY:  Clear to auscultation without rales, wheezing or rhonchi  ABDOMEN: Soft, non-tender, non-distended MUSCULOSKELETAL:  No edema; No deformity  SKIN: Warm and dry NEUROLOGIC:  Alert and oriented x 3 PSYCHIATRIC:  Normal affect    Signed, Cassie More, MD  04/29/2018 7:40 PM    [place]

## 2018-04-30 ENCOUNTER — Ambulatory Visit: Payer: 59 | Admitting: Cardiology

## 2018-06-12 DIAGNOSIS — J301 Allergic rhinitis due to pollen: Secondary | ICD-10-CM | POA: Diagnosis not present

## 2018-06-12 DIAGNOSIS — K219 Gastro-esophageal reflux disease without esophagitis: Secondary | ICD-10-CM | POA: Diagnosis not present

## 2018-09-12 DIAGNOSIS — R202 Paresthesia of skin: Secondary | ICD-10-CM | POA: Insufficient documentation

## 2018-11-29 ENCOUNTER — Other Ambulatory Visit: Payer: Self-pay | Admitting: Family Medicine

## 2018-11-29 DIAGNOSIS — R1013 Epigastric pain: Secondary | ICD-10-CM

## 2018-12-04 ENCOUNTER — Ambulatory Visit
Admission: RE | Admit: 2018-12-04 | Discharge: 2018-12-04 | Disposition: A | Discharge status: Home / Self Care | Payer: 59 | Source: Ambulatory Visit | Attending: Family Medicine | Admitting: Family Medicine

## 2018-12-04 DIAGNOSIS — R1013 Epigastric pain: Secondary | ICD-10-CM

## 2019-06-07 DIAGNOSIS — I10 Essential (primary) hypertension: Secondary | ICD-10-CM | POA: Insufficient documentation

## 2019-06-07 DIAGNOSIS — N920 Excessive and frequent menstruation with regular cycle: Secondary | ICD-10-CM | POA: Insufficient documentation

## 2019-06-07 DIAGNOSIS — R002 Palpitations: Secondary | ICD-10-CM | POA: Insufficient documentation

## 2019-06-07 DIAGNOSIS — K219 Gastro-esophageal reflux disease without esophagitis: Secondary | ICD-10-CM | POA: Insufficient documentation

## 2019-06-07 DIAGNOSIS — F419 Anxiety disorder, unspecified: Secondary | ICD-10-CM | POA: Insufficient documentation

## 2020-04-02 ENCOUNTER — Encounter (HOSPITAL_COMMUNITY): Payer: Self-pay | Admitting: *Deleted

## 2020-04-02 NOTE — Patient Instructions (Signed)
Cassie Kim  04/02/2020   Your procedure is scheduled on:  04/09/2020  Arrive at 1:45 PM at Entrance C on [address] at [facility]  and [address]. You are invited to use the FREE valet parking or use the Visitor's parking deck.  Pick up the phone at the desk and dial [phone].  Call this number if you have problems the morning of surgery: [phone]  Remember:   Do not eat food:(After Midnight) Desps de medianoche.  Do not drink clear liquids: (6 Hours before arrival) 6 horas ante llegada.  Take these medicines the morning of surgery with A SIP OF WATER:  none   Do not wear jewelry, make-up or nail polish.  Do not wear lotions, powders, or perfumes. Do not wear deodorant.  Do not shave 48 hours prior to surgery.  Do not bring valuables to the hospital.  [facility] is not   responsible for any belongings or valuables brought to the hospital.  Contacts, dentures or bridgework may not be worn into surgery.  Leave suitcase in the car. After surgery it may be brought to your room.  For patients admitted to the hospital, checkout time is 11:00 AM the day of              discharge.      Please read over the following fact sheets that you were given:     Preparing for Surgery

## 2020-04-05 ENCOUNTER — Encounter: Payer: Self-pay | Admitting: Cardiology

## 2020-04-07 ENCOUNTER — Other Ambulatory Visit: Payer: Self-pay

## 2020-04-07 ENCOUNTER — Encounter (HOSPITAL_COMMUNITY)
Admission: RE | Admit: 2020-04-07 | Discharge: 2020-04-07 | Disposition: A | Discharge status: Home / Self Care | Payer: No Typology Code available for payment source | Source: Ambulatory Visit | Attending: Obstetrics and Gynecology | Admitting: Obstetrics and Gynecology

## 2020-04-07 ENCOUNTER — Other Ambulatory Visit (HOSPITAL_COMMUNITY)
Admission: RE | Admit: 2020-04-07 | Discharge: 2020-04-07 | Disposition: A | Discharge status: Home / Self Care | Payer: No Typology Code available for payment source | Source: Ambulatory Visit | Attending: Obstetrics and Gynecology | Admitting: Obstetrics and Gynecology

## 2020-04-07 DIAGNOSIS — U071 COVID-19: Secondary | ICD-10-CM | POA: Insufficient documentation

## 2020-04-07 HISTORY — DX: Gestational (pregnancy-induced) hypertension without significant proteinuria, unspecified trimester: O13.9

## 2020-04-07 HISTORY — DX: Cardiac arrhythmia, unspecified: I49.9

## 2020-04-07 HISTORY — DX: Family history of other specified conditions: Z84.89

## 2020-04-07 HISTORY — DX: Anxiety disorder, unspecified: F41.9

## 2020-04-07 LAB — COMPREHENSIVE METABOLIC PANEL
ALT: 14 U/L (ref 0–44)
AST: 16 U/L (ref 15–41)
Albumin: 2.6 g/dL — ABNORMAL LOW (ref 3.5–5.0)
Alkaline Phosphatase: 50 U/L (ref 38–126)
Anion gap: 10 (ref 5–15)
BUN: 6 mg/dL (ref 6–20)
CO2: 22 mmol/L (ref 22–32)
Calcium: 8.6 mg/dL — ABNORMAL LOW (ref 8.9–10.3)
Chloride: 104 mmol/L (ref 98–111)
Creatinine, Ser: 0.57 mg/dL (ref 0.44–1.00)
GFR, Estimated: >60 mL/min (ref >60)
Glucose, Bld: 83 mg/dL (ref 70–99)
Potassium: 4.1 mmol/L (ref 3.5–5.1)
Sodium: 136 mmol/L (ref 135–145)
Total Bilirubin: 0.3 mg/dL (ref 0.3–1.2)
Total Protein: 6.1 g/dL — ABNORMAL LOW (ref 6.5–8.1)

## 2020-04-07 LAB — CBC
HCT: 34.8 % — ABNORMAL LOW (ref 36.0–46.0)
Hemoglobin: 11 g/dL — ABNORMAL LOW (ref 12.0–15.0)
MCH: 28.4 pg (ref 26.0–34.0)
MCHC: 31.6 g/dL (ref 30.0–36.0)
MCV: 89.9 fL (ref 80.0–100.0)
Platelets: 253 10*3/uL (ref 150–400)
RBC: 3.87 MIL/uL (ref 3.87–5.11)
RDW: 16.3 % — ABNORMAL HIGH (ref 11.5–15.5)
WBC: 7.9 10*3/uL (ref 4.0–10.5)
nRBC: 0 % (ref 0.0–0.2)

## 2020-04-07 LAB — SARS CORONAVIRUS 2 (TAT 6-24 HRS): SARS Coronavirus 2: POSITIVE — AB

## 2020-04-07 LAB — RPR: RPR Ser Ql: NONREACTIVE

## 2020-04-08 ENCOUNTER — Telehealth: Payer: Self-pay

## 2020-04-08 NOTE — Progress Notes (Signed)
Patient with positive covid result. Contacted MD and informed of result.   

## 2020-04-08 NOTE — Care Plan (Signed)
Infection Prevention  Received call from: Teresa Coombs, Pre-Admissions Nurse Labor and Delivery/OBOR Regarding: Patient with positive Covid test on 04/08/20. Chart review revealed that patient was positive Covid on 02/27/20, symptomatic and was treated. Pt no longer with symptoms. Outside infection window period.  IP Recommendation: No Airborne/Contact precautions needed. For other concerns please consult with the Provider team.

## 2020-04-08 NOTE — Telephone Encounter (Signed)
Called to discuss with patient about COVID-19 symptoms and the use of one of the available treatments for those with mild to moderate Covid symptoms and at a high risk of hospitalization.  Pt appears to qualify for outpatient treatment due to co-morbid conditions and/or a member of an at-risk group in accordance with the FDA Emergency Use Authorization.    Symptom onset: None Vaccinated: No Booster? No Immunocompromised? No Qualifiers: Pregnant  Pt. Reports she had COVID19 this past January. No symptoms today. Cassie Kim

## 2020-04-09 ENCOUNTER — Encounter (HOSPITAL_COMMUNITY)
Admission: RE | Disposition: A | Discharge status: Home / Self Care | Payer: Self-pay | Source: Home / Self Care | Attending: Obstetrics and Gynecology

## 2020-04-09 ENCOUNTER — Encounter (HOSPITAL_COMMUNITY): Payer: Self-pay | Admitting: Obstetrics and Gynecology

## 2020-04-09 ENCOUNTER — Inpatient Hospital Stay (HOSPITAL_COMMUNITY): Payer: No Typology Code available for payment source

## 2020-04-09 ENCOUNTER — Inpatient Hospital Stay (HOSPITAL_COMMUNITY)
Admission: RE | Admit: 2020-04-09 | Discharge: 2020-04-12 | DRG: 787 | Disposition: A | Discharge status: Home / Self Care | Payer: No Typology Code available for payment source | Attending: Obstetrics and Gynecology | Admitting: Obstetrics and Gynecology

## 2020-04-09 DIAGNOSIS — O99824 Streptococcus B carrier state complicating childbirth: Secondary | ICD-10-CM | POA: Diagnosis present

## 2020-04-09 DIAGNOSIS — O1002 Pre-existing essential hypertension complicating childbirth: Secondary | ICD-10-CM | POA: Diagnosis present

## 2020-04-09 DIAGNOSIS — D259 Leiomyoma of uterus, unspecified: Secondary | ICD-10-CM | POA: Diagnosis present

## 2020-04-09 DIAGNOSIS — Z6791 Unspecified blood type, Rh negative: Secondary | ICD-10-CM

## 2020-04-09 DIAGNOSIS — Z3A38 38 weeks gestation of pregnancy: Secondary | ICD-10-CM

## 2020-04-09 DIAGNOSIS — O3413 Maternal care for benign tumor of corpus uteri, third trimester: Secondary | ICD-10-CM | POA: Diagnosis present

## 2020-04-09 DIAGNOSIS — O403XX0 Polyhydramnios, third trimester, not applicable or unspecified: Secondary | ICD-10-CM | POA: Diagnosis present

## 2020-04-09 DIAGNOSIS — Z8616 Personal history of COVID-19: Secondary | ICD-10-CM

## 2020-04-09 DIAGNOSIS — Z98891 History of uterine scar from previous surgery: Secondary | ICD-10-CM

## 2020-04-09 DIAGNOSIS — O26893 Other specified pregnancy related conditions, third trimester: Secondary | ICD-10-CM | POA: Diagnosis present

## 2020-04-09 DIAGNOSIS — O34211 Maternal care for low transverse scar from previous cesarean delivery: Principal | ICD-10-CM | POA: Diagnosis present

## 2020-04-09 LAB — PREPARE RBC (CROSSMATCH)

## 2020-04-09 LAB — PROTEIN / CREATININE RATIO, URINE
Creatinine, Urine: 217.82 mg/dL
Protein Creatinine Ratio: 0.15 mg/mg{Cre} (ref 0.00–0.15)
Total Protein, Urine: 33 mg/dL

## 2020-04-09 SURGERY — Surgical Case
Anesthesia: Spinal

## 2020-04-09 MED ORDER — IBUPROFEN 800 MG PO TABS
800.0000 mg | ORAL_TABLET | Freq: Three times a day (TID) | ORAL | Status: DC
  Administered 2020-04-10 – 2020-04-12 (×7): 800 mg via ORAL
  Filled 2020-04-09 (×7): qty 1

## 2020-04-09 MED ORDER — BUPIVACAINE IN DEXTROSE 0.75-8.25 % IT SOLN
INTRATHECAL | Status: DC | PRN
  Administered 2020-04-09: 1.8 mL via INTRATHECAL

## 2020-04-09 MED ORDER — TETANUS-DIPHTH-ACELL PERTUSSIS 5-2.5-18.5 LF-MCG/0.5 IM SUSY: 0.5000 mL | PREFILLED_SYRINGE | Freq: Once | INTRAMUSCULAR | Status: DC

## 2020-04-09 MED ORDER — ONDANSETRON HCL 4 MG/2ML IJ SOLN
INTRAMUSCULAR | Status: DC | PRN
  Administered 2020-04-09: 4 mg via INTRAVENOUS

## 2020-04-09 MED ORDER — ENOXAPARIN SODIUM 60 MG/0.6ML ~~LOC~~ SOLN
60.0000 mg | SUBCUTANEOUS | Status: DC
  Administered 2020-04-10 – 2020-04-12 (×3): 60 mg via SUBCUTANEOUS
  Filled 2020-04-09 (×3): qty 0.6

## 2020-04-09 MED ORDER — FENTANYL CITRATE (PF) 100 MCG/2ML IJ SOLN: 25.0000 ug | INTRAMUSCULAR | Status: DC | PRN

## 2020-04-09 MED ORDER — NALBUPHINE HCL 10 MG/ML IJ SOLN: 5.0000 mg | INTRAMUSCULAR | Status: DC | PRN

## 2020-04-09 MED ORDER — FENTANYL CITRATE (PF) 100 MCG/2ML IJ SOLN
INTRAMUSCULAR | Status: AC
  Filled 2020-04-09: qty 2

## 2020-04-09 MED ORDER — COCONUT OIL OIL: 1.0000 "application " | TOPICAL_OIL | Status: DC | PRN

## 2020-04-09 MED ORDER — MORPHINE SULFATE (PF) 0.5 MG/ML IJ SOLN
INTRAMUSCULAR | Status: AC
  Filled 2020-04-09: qty 10

## 2020-04-09 MED ORDER — SIMETHICONE 80 MG PO CHEW
80.0000 mg | CHEWABLE_TABLET | Freq: Three times a day (TID) | ORAL | Status: DC
  Administered 2020-04-09 – 2020-04-12 (×8): 80 mg via ORAL
  Filled 2020-04-09 (×8): qty 1

## 2020-04-09 MED ORDER — SENNOSIDES-DOCUSATE SODIUM 8.6-50 MG PO TABS
2.0000 | ORAL_TABLET | Freq: Every day | ORAL | Status: DC
  Administered 2020-04-10 – 2020-04-12 (×3): 2 via ORAL
  Filled 2020-04-09 (×3): qty 2

## 2020-04-09 MED ORDER — NALBUPHINE HCL 10 MG/ML IJ SOLN: 5.0000 mg | Freq: Once | INTRAMUSCULAR | Status: DC | PRN

## 2020-04-09 MED ORDER — CEFAZOLIN SODIUM-DEXTROSE 2-4 GM/100ML-% IV SOLN
2.0000 g | INTRAVENOUS | Status: AC
  Administered 2020-04-09: 2 g via INTRAVENOUS

## 2020-04-09 MED ORDER — NALBUPHINE HCL 10 MG/ML IJ SOLN
5.0000 mg | INTRAMUSCULAR | Status: DC | PRN
Start: 2020-04-09 — End: 2020-04-12

## 2020-04-09 MED ORDER — DEXAMETHASONE SODIUM PHOSPHATE 10 MG/ML IJ SOLN
INTRAMUSCULAR | Status: AC
  Filled 2020-04-09: qty 1

## 2020-04-09 MED ORDER — SCOPOLAMINE 1 MG/3DAYS TD PT72
MEDICATED_PATCH | TRANSDERMAL | Status: AC
  Filled 2020-04-09: qty 1

## 2020-04-09 MED ORDER — MEPERIDINE HCL 25 MG/ML IJ SOLN: 6.2500 mg | INTRAMUSCULAR | Status: DC | PRN

## 2020-04-09 MED ORDER — ZOLPIDEM TARTRATE 5 MG PO TABS: 5.0000 mg | ORAL_TABLET | Freq: Every evening | ORAL | Status: DC | PRN

## 2020-04-09 MED ORDER — DIBUCAINE (PERIANAL) 1 % EX OINT: 1.0000 "application " | TOPICAL_OINTMENT | CUTANEOUS | Status: DC | PRN

## 2020-04-09 MED ORDER — KETOROLAC TROMETHAMINE 30 MG/ML IJ SOLN
30.0000 mg | Freq: Four times a day (QID) | INTRAMUSCULAR | Status: AC | PRN
  Administered 2020-04-09: 30 mg via INTRAMUSCULAR

## 2020-04-09 MED ORDER — PRENATAL MULTIVITAMIN CH
1.0000 | ORAL_TABLET | Freq: Every day | ORAL | Status: DC
  Administered 2020-04-10 – 2020-04-12 (×3): 1 via ORAL
  Filled 2020-04-09 (×3): qty 1

## 2020-04-09 MED ORDER — NALOXONE HCL 4 MG/10ML IJ SOLN
1.0000 ug/kg/h | INTRAVENOUS | Status: DC | PRN
  Filled 2020-04-09: qty 5

## 2020-04-09 MED ORDER — SOD CITRATE-CITRIC ACID 500-334 MG/5ML PO SOLN
30.0000 mL | Freq: Once | ORAL | Status: AC
  Administered 2020-04-09: 30 mL via ORAL

## 2020-04-09 MED ORDER — FENTANYL CITRATE (PF) 100 MCG/2ML IJ SOLN
INTRAMUSCULAR | Status: DC | PRN
  Administered 2020-04-09: 15 ug via INTRATHECAL

## 2020-04-09 MED ORDER — SOD CITRATE-CITRIC ACID 500-334 MG/5ML PO SOLN
ORAL | Status: AC
  Filled 2020-04-09: qty 30

## 2020-04-09 MED ORDER — SIMETHICONE 80 MG PO CHEW: 80.0000 mg | CHEWABLE_TABLET | ORAL | Status: DC | PRN

## 2020-04-09 MED ORDER — DIPHENHYDRAMINE HCL 50 MG/ML IJ SOLN: 12.5000 mg | INTRAMUSCULAR | Status: DC | PRN

## 2020-04-09 MED ORDER — LACTATED RINGERS IV SOLN: INTRAVENOUS | Status: DC

## 2020-04-09 MED ORDER — MORPHINE SULFATE (PF) 0.5 MG/ML IJ SOLN
INTRAMUSCULAR | Status: DC | PRN
  Administered 2020-04-09: .15 mg via INTRATHECAL

## 2020-04-09 MED ORDER — OXYTOCIN-SODIUM CHLORIDE 30-0.9 UT/500ML-% IV SOLN
2.5000 [IU]/h | INTRAVENOUS | Status: AC
  Administered 2020-04-09: 2.5 [IU]/h via INTRAVENOUS
  Filled 2020-04-09: qty 500

## 2020-04-09 MED ORDER — DEXAMETHASONE SODIUM PHOSPHATE 10 MG/ML IJ SOLN
INTRAMUSCULAR | Status: DC | PRN
  Administered 2020-04-09: 10 mg via INTRAVENOUS

## 2020-04-09 MED ORDER — BUPIVACAINE IN DEXTROSE 0.75-8.25 % IT SOLN
INTRATHECAL | Status: AC
  Filled 2020-04-09: qty 2

## 2020-04-09 MED ORDER — OXYTOCIN-SODIUM CHLORIDE 30-0.9 UT/500ML-% IV SOLN
INTRAVENOUS | Status: DC | PRN
  Administered 2020-04-09: 300 mL via INTRAVENOUS

## 2020-04-09 MED ORDER — POVIDONE-IODINE 10 % EX SWAB: 2.0000 "application " | Freq: Once | CUTANEOUS | Status: DC

## 2020-04-09 MED ORDER — WITCH HAZEL-GLYCERIN EX PADS: 1.0000 "application " | MEDICATED_PAD | CUTANEOUS | Status: DC | PRN

## 2020-04-09 MED ORDER — DIPHENHYDRAMINE HCL 25 MG PO CAPS: 25.0000 mg | ORAL_CAPSULE | Freq: Four times a day (QID) | ORAL | Status: DC | PRN

## 2020-04-09 MED ORDER — ONDANSETRON HCL 4 MG/2ML IJ SOLN: 4.0000 mg | Freq: Three times a day (TID) | INTRAMUSCULAR | Status: DC | PRN

## 2020-04-09 MED ORDER — OXYCODONE HCL 5 MG PO TABS: 5.0000 mg | ORAL_TABLET | ORAL | Status: DC | PRN

## 2020-04-09 MED ORDER — SODIUM CHLORIDE 0.9% IV SOLUTION: Freq: Once | INTRAVENOUS | Status: DC

## 2020-04-09 MED ORDER — PHENYLEPHRINE HCL-NACL 20-0.9 MG/250ML-% IV SOLN
INTRAVENOUS | Status: AC
  Filled 2020-04-09: qty 250

## 2020-04-09 MED ORDER — MENTHOL 3 MG MT LOZG: 1.0000 | LOZENGE | OROMUCOSAL | Status: DC | PRN

## 2020-04-09 MED ORDER — NALBUPHINE HCL 10 MG/ML IJ SOLN
5.0000 mg | Freq: Once | INTRAMUSCULAR | Status: DC | PRN
Start: 2020-04-09 — End: 2020-04-12

## 2020-04-09 MED ORDER — ONDANSETRON HCL 4 MG/2ML IJ SOLN
INTRAMUSCULAR | Status: AC
  Filled 2020-04-09: qty 2

## 2020-04-09 MED ORDER — DIPHENHYDRAMINE HCL 25 MG PO CAPS: 25.0000 mg | ORAL_CAPSULE | ORAL | Status: DC | PRN

## 2020-04-09 MED ORDER — SCOPOLAMINE 1 MG/3DAYS TD PT72
1.0000 | MEDICATED_PATCH | TRANSDERMAL | Status: DC
  Administered 2020-04-09: 1.5 mg via TRANSDERMAL

## 2020-04-09 MED ORDER — CEFAZOLIN SODIUM-DEXTROSE 2-4 GM/100ML-% IV SOLN
INTRAVENOUS | Status: AC
  Filled 2020-04-09: qty 100

## 2020-04-09 MED ORDER — KETOROLAC TROMETHAMINE 30 MG/ML IJ SOLN
30.0000 mg | Freq: Four times a day (QID) | INTRAMUSCULAR | Status: AC | PRN
  Administered 2020-04-09: 30 mg via INTRAVENOUS
  Filled 2020-04-09: qty 1

## 2020-04-09 MED ORDER — SODIUM CHLORIDE 0.9% FLUSH: 3.0000 mL | INTRAVENOUS | Status: DC | PRN

## 2020-04-09 MED ORDER — ACETAMINOPHEN 500 MG PO TABS
1000.0000 mg | ORAL_TABLET | Freq: Four times a day (QID) | ORAL | Status: DC
  Administered 2020-04-09 – 2020-04-12 (×11): 1000 mg via ORAL
  Filled 2020-04-09 (×12): qty 2

## 2020-04-09 MED ORDER — NALOXONE HCL 0.4 MG/ML IJ SOLN: 0.4000 mg | INTRAMUSCULAR | Status: DC | PRN

## 2020-04-09 MED ORDER — PHENYLEPHRINE HCL-NACL 20-0.9 MG/250ML-% IV SOLN
INTRAVENOUS | Status: DC | PRN
  Administered 2020-04-09: 60 ug/min via INTRAVENOUS

## 2020-04-09 SURGICAL SUPPLY — 35 items
APL SKNCLS STERI-STRIP NONHPOA (GAUZE/BANDAGES/DRESSINGS) ×1
BENZOIN TINCTURE PRP APPL 2/3 (GAUZE/BANDAGES/DRESSINGS) ×2 IMPLANT
CHLORAPREP W/TINT 26ML (MISCELLANEOUS) ×2 IMPLANT
CLAMP CORD UMBIL (MISCELLANEOUS) IMPLANT
CLOTH BEACON ORANGE TIMEOUT ST (SAFETY) ×2 IMPLANT
DRSG OPSITE POSTOP 4X10 (GAUZE/BANDAGES/DRESSINGS) ×2 IMPLANT
ELECT REM PT RETURN 9FT ADLT (ELECTROSURGICAL) ×2
ELECTRODE REM PT RTRN 9FT ADLT (ELECTROSURGICAL) ×1 IMPLANT
EXTRACTOR VACUUM KIWI (MISCELLANEOUS) IMPLANT
GLOVE BIO SURGEON STRL SZ 6.5 (GLOVE) ×2 IMPLANT
GLOVE BIOGEL PI IND STRL 7.0 (GLOVE) ×1 IMPLANT
GLOVE BIOGEL PI INDICATOR 7.0 (GLOVE) ×1
GOWN STRL REUS W/TWL LRG LVL3 (GOWN DISPOSABLE) ×4 IMPLANT
KIT ABG SYR 3ML LUER SLIP (SYRINGE) IMPLANT
NEEDLE HYPO 25X5/8 SAFETYGLIDE (NEEDLE) IMPLANT
NS IRRIG 1000ML POUR BTL (IV SOLUTION) ×2 IMPLANT
PACK C SECTION WH (CUSTOM PROCEDURE TRAY) ×2 IMPLANT
PAD OB MATERNITY 4.3X12.25 (PERSONAL CARE ITEMS) ×2 IMPLANT
PENCIL SMOKE EVAC W/HOLSTER (ELECTROSURGICAL) ×2 IMPLANT
RTRCTR C-SECT PINK 25CM LRG (MISCELLANEOUS) ×2 IMPLANT
STRIP CLOSURE SKIN 1/2X4 (GAUZE/BANDAGES/DRESSINGS) IMPLANT
STRIP SURGICAL 1/4 X 6 IN (GAUZE/BANDAGES/DRESSINGS) ×2 IMPLANT
SUT CHROMIC 1 CTX 36 (SUTURE) ×4 IMPLANT
SUT PLAIN 0 NONE (SUTURE) IMPLANT
SUT PLAIN 2 0 XLH (SUTURE) ×2 IMPLANT
SUT VIC AB 0 CT1 27 (SUTURE) ×6
SUT VIC AB 0 CT1 27XBRD ANBCTR (SUTURE) ×3 IMPLANT
SUT VIC AB 2-0 CT1 27 (SUTURE) ×2
SUT VIC AB 2-0 CT1 TAPERPNT 27 (SUTURE) ×1 IMPLANT
SUT VIC AB 3-0 CT1 27 (SUTURE)
SUT VIC AB 3-0 CT1 TAPERPNT 27 (SUTURE) IMPLANT
SUT VIC AB 4-0 KS 27 (SUTURE) ×2 IMPLANT
TOWEL OR 17X24 6PK STRL BLUE (TOWEL DISPOSABLE) ×2 IMPLANT
TRAY FOLEY W/BAG SLVR 14FR LF (SET/KITS/TRAYS/PACK) ×2 IMPLANT
WATER STERILE IRR 1000ML POUR (IV SOLUTION) ×2 IMPLANT

## 2020-04-09 NOTE — Anesthesia Procedure Notes (Signed)
Spinal  Patient location during procedure: OR Start time: 04/09/2020 3:35 PM End time: 04/09/2020 3:45 PM Staffing Performed: anesthesiologist  Anesthesiologist: Josephine Igo, MD Preanesthetic Checklist Completed: patient identified, IV checked, site marked, risks and benefits discussed, surgical consent, monitors and equipment checked, pre-op evaluation and timeout performed Spinal Block Patient position: sitting Prep: DuraPrep and site prepped and draped Patient monitoring: cardiac monitor, continuous pulse ox, blood pressure and heart rate Approach: midline Location: L3-4 Injection technique: catheter Needle Needle type: Tuohy and Spinocan  Needle gauge: 24 G Needle length: 12.7 cm Needle insertion depth: 7 cm Catheter type: closed end flexible Catheter size: 19 g Assessment Sensory level: T4 Additional Notes Epidural performed using LOR with air technique. No CSF, Heme or paresthesias. SAB performed through the epidural needle using 24ga Spinocan needle. CSF clear with free flow and no paresthesias. Local anesthetic and narcotics injected through the spinal needle and withdrawn. Epidural catheter threaded 5cm into the epidural space and the epidural needle was withdrawn. A sterile dressing was applied and the patient placed supine with LUD. The patient tolerated the procedure well and adequate sensory level was obtained.

## 2020-04-09 NOTE — H&P (Signed)
Cassie Kim is a 45 y.o. female [id number] at 78 0/7 weeks (EDD 04/22/20 by LMP c/w 6 week [place]) presenting for scheduled repeat c-section for [facility] with BP slightly trending up the last 2 weeks.  No s/s of preeclampsia and labs WNL. .   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 Anxiety    Was on Zoloft & Ativan pre-pregnancy prn  . Anxiety   . Childhood asthma   . Constipation   . Dysrhythmia    h/o palpitations with work-up in 2011 [facility] cardiology  . Dysrhythmia    PAC or PVC  . Family history of adverse reaction to anesthesia    Grandmother does unsure of reaction  . GERD (gastroesophageal reflux disease)    Takes Zantac  . Headache(784.0)   . Hypertension   . Pregnancy induced hypertension    Past Surgical History:  Procedure Laterality Date  . CESAREAN SECTION    . CESAREAN SECTION N/A 05/29/2012   Procedure: CESAREAN SECTION;  Surgeon: Allyn Kenner, DO;  Location: [facility];  Service: Obstetrics;  Laterality: N/A;  . CESAREAN SECTION     Family History: family history includes Hypertension in her maternal grandmother and paternal grandmother. Social History:  reports that she has never smoked. She has  never used smokeless tobacco. She reports previous alcohol use. She reports that she does not use drugs.     Maternal Diabetes: No Genetic Screening: Declined Maternal Ultrasounds/Referrals: Other: Fetal Ultrasounds or other Referrals:  None Maternal Substance Abuse:  No Significant Maternal Medications:  Meds include: Other: Baby ASA Significant Maternal Lab Results:  Group B Strep positive and Rh negative Other Comments:  None  Review of Systems  Gastrointestinal: Negative for abdominal pain.  Neurological: Negative for headaches.   Maternal Medical History:  Contractions: Frequency: irregular.   Perceived severity is mild.    Fetal activity: Perceived fetal activity is normal.    Prenatal complications: CHTN, prior C/S x 3, AMA, Anxiety, fibroids, polyhydramnios  Prenatal Complications - Diabetes: none.      There were no vitals taken for this visit. Maternal Exam:  Uterine Assessment: Contraction strength is mild.  Contraction frequency is irregular.   Abdomen: Patient reports no abdominal tenderness. Surgical scars: low transverse.   Introitus: Normal vulva. Normal vagina.    Physical Exam Cardiovascular:     Rate and Rhythm: Normal rate and regular rhythm.  Pulmonary:     Effort: Pulmonary effort is normal.  Abdominal:     Palpations: Abdomen is soft.  Genitourinary:    General: Normal vulva.  Neurological:     Mental Status: She is alert.     Prenatal labs: ABO, Rh: --/--/A NEG (  02/23 1038) Antibody: POS (02/23 1038) Rubella:  Immune RPR: NON REACTIVE (02/23 1030)  HBsAg:   Neg HIV:   NR GBS:   positive Hgb AA One hour GCT 122  Assessment/Plan:   d/w pt repeat c-section risks and benefits in detail including  bleeding, infection, and possible damage to bowel and bladder.  We reviewed bladder injury in detail given this is c-section #4.  Last c-section the doctor told her her LUS was "like two wet paper towels."  She has decided against tubal  sterilization, husband getting vasectomy and may also want [place] 04/09/2020, 6:39 AM

## 2020-04-09 NOTE — Anesthesia Preprocedure Evaluation (Addendum)
Anesthesia Evaluation  Patient identified by MRN, date of birth, ID band Patient awake    Reviewed: Allergy & Precautions, H&P , NPO status , Patient's Chart, lab work & pertinent test results, reviewed documented beta blocker date and time   History of Anesthesia Complications (+) Family history of anesthesia reaction  Airway Mallampati: I  TM Distance: >3 FB Neck ROM: Full    Dental no notable dental hx. (+) Teeth Intact, Chipped,    Pulmonary neg pulmonary ROS, asthma ,    Pulmonary exam normal breath sounds clear to auscultation       Cardiovascular Exercise Tolerance: Good hypertension, Normal cardiovascular exam+ dysrhythmias + Valvular Problems/Murmurs  Rhythm:regular Rate:Normal  Hx/o benign palpitations   Neuro/Psych  Headaches, Anxiety negative neurological ROS     GI/Hepatic negative GI ROS, Neg liver ROS, GERD  Medicated and Controlled,  Endo/Other  negative endocrine ROSObesity  Renal/GU negative Renal ROS  negative genitourinary   Musculoskeletal negative musculoskeletal ROS (+)   Abdominal Normal abdominal exam  (+)   Peds  Hematology negative hematology ROS (+) anemia ,   Anesthesia Other Findings GERD (gastroesophageal reflux disease)   Takes Zantac Anxiety   Was on Zoloft & Ativan pre-pregnancy prn    Dysrhythmia   h/o palpitations with work-up in 2011 [facility] cardiology Headache           Reproductive/Obstetrics (+) Pregnancy Previous C/Section x 3 Polyhydramnios AMA Uterine fibroids                             Anesthesia Physical  Anesthesia Plan  ASA: II  Anesthesia Plan: Spinal and Combined Spinal and Epidural   Post-op Pain Management:    Induction:   PONV Risk Score and Plan:   Airway Management Planned: Natural Airway  Additional Equipment:   Intra-op Plan:   Post-operative Plan:   Informed Consent: I have reviewed the patients History and  Physical, chart, labs and discussed the procedure including the risks, benefits and alternatives for the proposed anesthesia with the patient or authorized representative who has indicated his/her understanding and acceptance.     Dental advisory given  Plan Discussed with: Anesthesiologist, CRNA and Surgeon  Anesthesia Plan Comments:        Anesthesia Quick Evaluation

## 2020-04-09 NOTE — Lactation Note (Signed)
This note was copied from a baby's chart. Lactation Consultation Note  Patient Name: Cassie Kim RJJOA'C Date: 04/09/2020 Reason for consult: Initial assessment;Early term 37-38.6wks;Other (Comment) (AMA) Age:44 hours  Visited with mom of 25 hours old ETI female, she's a P5 and experienced BF. Baby already nursing when entering the room, but mom had her swaddled, LC recommended removing blankets, mom will try that on the next feeding.  Assisted with positioning to try to make latch a bit deeper, baby was somehow shallow but a few audible swallows noted during this 20 minutes feeding. Baby nursed in cross cradle hold, asked mom to call for assistance when needed.  She voiced even though she's experienced BF her last baby is 56 y.o and she may have forgotten some "tips and tricks"; baby had difficulty latching on the first two attempts. Reviewed normal newborn behavior, cluster feeding, feeding cues and size of baby's stomach.   Feeding plan  1. Encouraged mom to feed baby STS 8-12 times/24 hours or sooner if feeding cues are present 2. Hand expression and breast massage were also encouraged prior feeding  BF brochure, BF resources and feeding diary were reviewed. FOB present and supportive. Parents reported all questions and concerns were answered, they're both aware of [facility] OP services and will call PRN.   Maternal Data Has patient been taught Hand Expression?: Yes Does the patient have breastfeeding experience prior to this delivery?: Yes How long did the patient breastfeed?: 1st baby for 8 months, 2nd one for 3 months, 3rd one for 11-12 months and 4th one for 6 months  Feeding Mother's Current Feeding Choice: Breast Milk  LATCH Score Latch: Repeated attempts needed to sustain latch, nipple held in mouth throughout feeding, stimulation needed to elicit sucking reflex.  Audible Swallowing: A few with stimulation  Type of Nipple: Everted at rest and after stimulation  Comfort  (Breast/Nipple): Soft / non-tender  Hold (Positioning): Assistance needed to correctly position infant at breast and maintain latch. (baby was swadled with blanket)  LATCH Score: 7   Lactation Tools Discussed/Used    Interventions Interventions: Breast feeding basics reviewed;Assisted with latch;Breast massage;Hand express;Breast compression;Adjust position;Support pillows  Discharge Pump: Personal ([employer] DEBP at home) [facility] Program: Yes  Consult Status Consult Status: Follow-up Date: 04/10/20 Follow-up type: In-patient    Lorraine Cimmino Francene Boyers 04/09/2020, 9:33 PM

## 2020-04-09 NOTE — Transfer of Care (Signed)
Immediate Anesthesia Transfer of Care Note  Patient: Cassie Kim  Procedure(s) Performed: REPEAT CESAREAN SECTION (N/A )  Patient Location: PACU  Anesthesia Type:Spinal and Epidural  Level of Consciousness: awake  Airway & Oxygen Therapy: Patient Spontanous Breathing  Post-op Assessment: Report given to RN  Post vital signs: Reviewed and stable  Last Vitals:  Vitals Value Taken Time  BP    Temp    Pulse    Resp    SpO2      Last Pain:  Vitals:   04/09/20 1430  TempSrc:   PainSc: 0-No pain         Complications: No complications documented.

## 2020-04-09 NOTE — Op Note (Signed)
Operative Note    Preoperative Diagnosis Term pregnancy at 53 0/7 weeks CHTN with exacerbation Prior c-section x 3 Fibroid uterus Polyhydramnios  Postoperative Diagnosis same  Procedure Repeat low transverse c-section with 2 layer closure of uterus  Surgeon Paula Compton, MD  Anesthesia Combined spinal/epidural  Fluids: EBL 352mL UOP 111mL clear IVF 2353mL LR  Findings: A viable female infant in the vertex presentation  Apgars 8,9 Weight pending Uterus enlarged with multiple fibroids.  Largerst 5-6 cm in left LUS.  Moderate polyhydramnios.  Normal tubes and ovaries. Minimal pelvic and intraabdominal adhesions.  Thin lower uterine segment  Specimen Placenta to L&D  Procedure Note Patient was taken to the operating room where combined spinal/epidural anesthesia was obtained and found to be adequate by Allis clamp test. She was prepped and draped in the normal sterile fashion in the dorsal supine position with a leftward tilt. An appropriate time out was performed. A Pfannenstiel skin incision was then made through a pre-existing scar with the scalpel and carried through to the underlying layer of fascia by sharp dissection and Bovie cautery. The fascia was nicked in the midline and the incision was extended laterally with Mayo scissors. The inferior aspect of the incision was grasped Coker clamps and dissected off the underlying rectus muscles. In a similar fashion the superior aspect was dissected off the rectus muscles. Rectus muscles were separated in the midline and the peritoneal cavity entered bluntly. The peritoneal incision was then extended both superiorly and inferiorly with careful attention to avoid both bowel and bladder. The Alexis self-retaining wound retractor was then placed within the incision and the lower uterine segment exposed. The bladder flap was developed with Metzenbaum scissors and pushed away from the lower uterine segment. The lower uterine segment was  noted to be thin and was  then incised in a transverse fashion and the cavity itself entered bluntly. The incision was extended bluntly. The infant's head was then lifted and delivered up to the incision, but could not quite be expressed with fundal pressure.  The Kiwi vacumm was then applied and with gentle traction and no pop-offs the head delivered from the incision without difficulty. The remainder of the infant delivered and the nose and mouth bulb suctioned with the cord clamped and cut as well after delayed cord clamping. The infant was handed off to the waiting pediatricians. The placenta was then spontaneously expressed from the uterus and the uterus cleared of all clots and debris with moist lap sponge. The uterine incision was then repaired in 2 layers the first layer was a running locked layer of 1-0 chromic and the second an imbricating layer of the same suture. The tubes and ovaries were inspected and the gutters cleared of all clots and debris. The uterine incision was inspected and found to be hemostatic. All instruments and sponges as well as the Alexis retractor were then removed from the abdomen. The rectus muscles and peritoneum were then reapproximated with a running suture of 2-0 Vicryl. A small defect in the right rectus muscle lateral to the midline was re-approximated with o-vicryl. The fascia was then closed with 0 Vicryl in a running fashion. Subcutaneous tissue was reapproximated with 3-0 plain in a running fashion. The skin was closed with a subcuticular stitch of 4-0 Vicryl on a Keith needle and then reinforced with benzoin and Steri-Strips. At the conclusion of the procedure all instruments and sponge counts were correct. Patient was taken to the recovery room in good condition with her baby  accompanying her skin to skin.

## 2020-04-10 LAB — COMPREHENSIVE METABOLIC PANEL
ALT: 13 U/L (ref 0–44)
AST: 19 U/L (ref 15–41)
Albumin: 2 g/dL — ABNORMAL LOW (ref 3.5–5.0)
Alkaline Phosphatase: 36 U/L — ABNORMAL LOW (ref 38–126)
Anion gap: 7 (ref 5–15)
BUN: 8 mg/dL (ref 6–20)
CO2: 20 mmol/L — ABNORMAL LOW (ref 22–32)
Calcium: 8.6 mg/dL — ABNORMAL LOW (ref 8.9–10.3)
Chloride: 103 mmol/L (ref 98–111)
Creatinine, Ser: 0.66 mg/dL (ref 0.44–1.00)
GFR, Estimated: >60 mL/min (ref >60)
Glucose, Bld: 151 mg/dL — ABNORMAL HIGH (ref 70–99)
Potassium: 4 mmol/L (ref 3.5–5.1)
Sodium: 130 mmol/L — ABNORMAL LOW (ref 135–145)
Total Bilirubin: 0.5 mg/dL (ref 0.3–1.2)
Total Protein: 4.8 g/dL — ABNORMAL LOW (ref 6.5–8.1)

## 2020-04-10 LAB — CBC
HCT: 28.2 % — ABNORMAL LOW (ref 36.0–46.0)
Hemoglobin: 9.1 g/dL — ABNORMAL LOW (ref 12.0–15.0)
MCH: 28.7 pg (ref 26.0–34.0)
MCHC: 32.3 g/dL (ref 30.0–36.0)
MCV: 89 fL (ref 80.0–100.0)
Platelets: 219 10*3/uL (ref 150–400)
RBC: 3.17 MIL/uL — ABNORMAL LOW (ref 3.87–5.11)
RDW: 16.3 % — ABNORMAL HIGH (ref 11.5–15.5)
WBC: 11.1 10*3/uL — ABNORMAL HIGH (ref 4.0–10.5)
nRBC: 0 % (ref 0.0–0.2)

## 2020-04-10 MED ORDER — RHO D IMMUNE GLOBULIN 1500 UNIT/2ML IJ SOSY
300.0000 ug | PREFILLED_SYRINGE | Freq: Once | INTRAMUSCULAR | Status: AC
  Administered 2020-04-10: 300 ug via INTRAVENOUS
  Filled 2020-04-10: qty 2

## 2020-04-10 NOTE — Lactation Note (Signed)
This note was copied from a baby's chart. Lactation Consultation Note  Patient Name: Cassie Kim'Z Date: 04/10/2020 Reason for consult: Follow-up assessment Age:44 hours  LC Follow Up Visit:  RN requested latch assistance;  Baby in father's arms and not showing feeding cues when I arrived. RN had attempted to assist with breast feeding recently. Suggested parents observe for cues and call me back when she is ready to feed.  Mother willing to call.   Maternal Data    Feeding    LATCH Score Latch: Repeated attempts needed to sustain latch, nipple held in mouth throughout feeding, stimulation needed to elicit sucking reflex.  Audible Swallowing: None  Type of Nipple: Everted at rest and after stimulation  Comfort (Breast/Nipple): Soft / non-tender  Hold (Positioning): Assistance needed to correctly position infant at breast and maintain latch.  LATCH Score: 6   Lactation Tools Discussed/Used    Interventions    Discharge    Consult Status Consult Status: Follow-up Date: 04/10/20 Follow-up type: In-patient    Beth R DelFava 04/10/2020, 10:11 AM

## 2020-04-10 NOTE — Lactation Note (Signed)
This note was copied from a baby's chart. Lactation Consultation Note  Patient Name: Cassie Kim UYEBX'I Date: 04/10/2020 Reason for consult: Follow-up assessment;Early term 37-38.6wks Age:44 hours   RN called for latch assistance.  Father changing infant's diaper when I arrived.  Positioned mother appropriately and asked mother to hand express colostrum.  She was able to obtain one drop which I finger fed to baby.  Assess baby's suck to be very tight and "biting."  Suck training performed.  Assisted baby to latch to the right breast in the cross cradle hold.  Baby latched easily, however, she was not at all interested in beginning to suck.  Demonstrated breast compressions and gentle stimulation but baby still not interested in sucking.  Placed her STS and she fell asleep.  Mother will order lunch and call me back when baby shows cues.  Encouraged hand expression and feeding back any EBM she obtains to baby.  RN updated.     Maternal Data    Feeding    LATCH Score Latch: Too sleepy or reluctant, no latch achieved, no sucking elicited.  Audible Swallowing: None  Type of Nipple: Everted at rest and after stimulation  Comfort (Breast/Nipple): Soft / non-tender  Hold (Positioning): Assistance needed to correctly position infant at breast and maintain latch.  LATCH Score: 5   Lactation Tools Discussed/Used    Interventions Interventions: Breast feeding basics reviewed;Assisted with latch;Skin to skin;Breast massage;Hand express;Breast compression;Adjust position;Position options;Support pillows;Education  Discharge    Consult Status Consult Status: Follow-up Date: 04/11/20 Follow-up type: In-patient    Little Ishikawa 04/10/2020, 12:51 PM

## 2020-04-10 NOTE — Progress Notes (Addendum)
Subjective: Postpartum Day 1: Cesarean Delivery Patient reports tolerating PO.  Ambulated a little.  Catheter just out. Objective: Vital signs in last 24 hours: Temp:  [97.4 F (36.3 C)-99 F (37.2 C)] 97.7 F (36.5 C) (02/26 0749) Pulse Rate:  [51-104] 51 (02/26 0749) Resp:  [10-28] 18 (02/26 0749) BP: (107-169)/(57-80) 107/57 (02/26 0749) SpO2:  [97 %-100 %] 99 % (02/26 0749) Weight:  [110.2 kg] 110.2 kg (02/25 1419)  Physical Exam:  General: alert and cooperative Lochia: appropriate Uterine Fundus: firm Incision: C/D/I   Recent Labs    04/10/20 0606  HGB 9.1*  HCT 28.2*    Assessment/Plan: Status post Cesarean section. Doing well postoperatively.  BP thus far look normal with no meds needed. Continue current care.  Logan Bores 04/10/2020, 10:42 AM

## 2020-04-10 NOTE — Social Work (Signed)
MOB was referred for history of anxiety.   * Referral screened out by Clinical Social Worker because none of the following criteria appear to apply:  ~ History of anxiety/depression during this pregnancy, or of post-partum depression following prior delivery. ~ Diagnosis of anxiety and/or depression within last 3 years. Per notes, diagnosed in 1997.  OR * MOB's symptoms currently being treated with medication and/or therapy. MOB currently has counselor.   Please contact the Clinical Social Worker if needs arise, by [facility] request, or if MOB scores greater than 9/yes to question 10 on Edinburgh Postpartum Depression Screen.  Darra Lis, [place] [employer]  [phone]

## 2020-04-10 NOTE — Anesthesia Postprocedure Evaluation (Signed)
Anesthesia Post Note  Patient: ANIAYAH ALANIZ  Procedure(s) Performed: REPEAT CESAREAN SECTION (N/A )     Patient location during evaluation: PACU Anesthesia Type: Spinal Level of consciousness: oriented and awake and alert Pain management: pain level controlled Vital Signs Assessment: post-procedure vital signs reviewed and stable Respiratory status: spontaneous breathing, respiratory function stable and nonlabored ventilation Cardiovascular status: blood pressure returned to baseline and stable Postop Assessment: no headache, no backache, no apparent nausea or vomiting, spinal receding and patient able to bend at knees Anesthetic complications: no   No complications documented.  Last Vitals:  Vitals:   04/09/20 2340 04/10/20 0336  BP: (!) 114/59   Pulse: (!) 59   Resp: 18   Temp: 36.7 C (!) 36.3 C  SpO2: 97% 97%    Last Pain:  Vitals:   04/10/20 0336  TempSrc: Axillary  PainSc:    Pain Goal: Patients Stated Pain Goal: 5 (04/09/20 1820)                 Markees Carns A.

## 2020-04-11 LAB — TYPE AND SCREEN
ABO/RH(D): A NEG
Antibody Screen: POSITIVE
Unit division: 0
Unit division: 0

## 2020-04-11 LAB — RH IG WORKUP (INCLUDES ABO/RH)
ABO/RH(D): A NEG
Fetal Screen: NEGATIVE
Gestational Age(Wks): 38.1
Unit division: 0

## 2020-04-11 LAB — BPAM RBC
Blood Product Expiration Date: 202203282359
Blood Product Expiration Date: 202203282359
Unit Type and Rh: 600
Unit Type and Rh: 600

## 2020-04-11 NOTE — Lactation Note (Signed)
This note was copied from a baby's chart. Lactation Consultation Note  Patient Name: Cassie Kim [id number] Date: 04/11/2020 Reason for consult: Follow-up assessment Age:44 hours  P5 mother whose infant is now 44 hours old.  This is an ETI at 38+1 weeks.  Mother has breast feeding experience with her other children.  Per MD conversation earlier today, baby is to be reweighed at 1200 to determine possibility of being discharged home.  RN in room when I arrived.  Offered to assist with latching prior to weight check.  Mother agreeable.  Attempted to latch to the right breast in the cross cradle hold.  Baby was able to latch, however, was very sleepy and not at all interested in begin to suck.  Removed baby from the breast and asked mother to hand express colostrum for supplementation.  Mother was able to express approximately 3 mls of colostrum which I spoon fed back to baby.  She also appears more jaundiced today than yesterday.  Demonstrated awakening techniques.  Baby awakened but continued to be sleepy.  Suggested mother try the football hold on the opposite breast.  Again, baby able to latch and then suckled a few minutes but the sucking was weak and ineffective.  Suggested RN be called for reweighing.  RN in room and baby now weighs 2815 gms, loss of 26 grams from prior weight check.  Baby had a large pasty green bowel movement prior to weight check.  Placed baby STS on mother's chest and asked her to observe for cues.  RN to inform MD.  I saw MD after I left the room and updated him.  He will visit family and feeding plan will be determined.  MD/RN to alert me if baby will be discharged.  If mother stays another night I will begin pumping with the DEBP.  Will  continue to monitor.    Maternal Data    Feeding    LATCH Score Latch: Repeated attempts needed to sustain latch, nipple held in mouth throughout feeding, stimulation needed to elicit sucking reflex.  Audible Swallowing:  None  Type of Nipple: Everted at rest and after stimulation  Comfort (Breast/Nipple): Soft / non-tender  Hold (Positioning): Assistance needed to correctly position infant at breast and maintain latch.  LATCH Score: 6   Lactation Tools Discussed/Used    Interventions Interventions: Breast feeding basics reviewed;Assisted with latch;Skin to skin;Hand express;Breast compression;Adjust position;Expressed milk;Position options;Support pillows;Education  Discharge    Consult Status Consult Status: Follow-up Date: 04/12/20 Follow-up type: In-patient    Little Ishikawa 04/11/2020, 12:28 PM

## 2020-04-11 NOTE — Progress Notes (Addendum)
Subjective: Postpartum Day 2: Cesarean Delivery Patient reports tolerating PO, + flatus and no problems voiding.  Ambulating well.  Pt would like to stay one more night.  Objective: Vital signs in last 24 hours: Temp:  [97.8 F (36.6 C)-98.2 F (36.8 C)] 98.2 F (36.8 C) (02/26 2146) Pulse Rate:  [66-75] 66 (02/26 2146) Resp:  [17-18] 18 (02/26 2146) BP: (111-119)/(59) 111/59 (02/26 2146) SpO2:  [100 %] 100 % (02/26 1412)  Physical Exam:  General: alert and cooperative Lochia: appropriate Uterine Fundus: firm Incision clear with a small amount of dried blood on bandage  Recent Labs    04/10/20 0606  HGB 9.1*  HCT 28.2*    Assessment/Plan: Status post Cesarean section. Doing well postoperatively.  Continue current care. BP are totally WNL  Logan Bores 04/11/2020, 9:46 AM

## 2020-04-12 MED ORDER — IBUPROFEN 800 MG PO TABS: 800.0000 mg | ORAL_TABLET | Freq: Three times a day (TID) | ORAL | 0 refills | Status: AC

## 2020-04-12 MED ORDER — OXYCODONE HCL 5 MG PO TABS: 5.0000 mg | ORAL_TABLET | ORAL | 0 refills | Status: DC | PRN

## 2020-04-12 MED ORDER — METOPROLOL TARTRATE 50 MG PO TABS
50.0000 mg | ORAL_TABLET | Freq: Every day | ORAL | Status: DC
  Filled 2020-04-12: qty 1

## 2020-04-12 MED ORDER — NIFEDIPINE ER OSMOTIC RELEASE 30 MG PO TB24: 30.0000 mg | ORAL_TABLET | Freq: Every day | ORAL | Status: DC

## 2020-04-12 MED ORDER — METOPROLOL TARTRATE 50 MG PO TABS: 50.0000 mg | ORAL_TABLET | Freq: Every day | ORAL | 1 refills | Status: AC

## 2020-04-12 NOTE — Discharge Instructions (Signed)
As per discharge pamphlet °

## 2020-04-12 NOTE — Discharge Summary (Signed)
Postpartum Discharge Summary      Patient Name: Cassie Kim DOB: 23-Oct-1975 MRN: [id number]  Date of admission: 04/09/2020 Delivery date:04/09/2020  Delivering provider: Paula Kim  Date of discharge: 04/12/2020  Admitting diagnosis: S/P repeat low transverse C-section [Z98.891] Intrauterine pregnancy: [redacted]w[redacted]d     Secondary diagnosis:  Active Problems:   S/P repeat low transverse C-section     Discharge diagnosis: Term Pregnancy Delivered and [facility] course: Sceduled C/S   45 y.o. yo [id number] at [redacted]w[redacted]d was admitted to the hospital 04/09/2020 for scheduled cesarean section with the following indication:Elective Repeat.Delivery details are as follows:  Membrane Rupture Time/Date: 4:08 PM ,04/09/2020   Delivery Method:C-Section, Low Transverse  Details of operation can be found in separate operative note.  Patient had an uncomplicated postpartum course.  BP rising a bit on POD #3, restarted back on her Lopressor 50 mg daily.  She is ambulating, tolerating a regular diet, passing flatus, and urinating well. Patient is discharged home in stable condition on  04/12/20        Newborn Data: Birth date:04/09/2020  Birth time:4:08 PM  Gender:Female  Living status:Living  Apgars:9 ,9  Weight:3125 g      Physical exam  Vitals:   04/11/20 2226 04/12/20 0500 04/12/20 0604 04/12/20 0606  BP: (!) 142/70 (!) 157/79 (!) 157/79 (!) 147/73  Pulse:  68 68 69  Resp:  17 17   Temp:  98.3 F (36.8 C) 98.3 F (36.8 C)   TempSrc:  Oral    SpO2:  100%    Weight:      Height:       General: alert Lochia: appropriate Uterine Fundus: firm Incision: Healing well with no significant drainage  Labs: Lab Results  Component Value Date   WBC 11.1 (H) 04/10/2020   HGB 9.1 (L) 04/10/2020   HCT 28.2 (L) 04/10/2020   MCV 89.0 04/10/2020   PLT 219 04/10/2020   CMP Latest Ref Rng & Units 04/10/2020  Glucose 70 - 99 mg/dL 151(H)  BUN 6 - 20  mg/dL 8  Creatinine 0.44 - 1.00 mg/dL 0.66  Sodium 135 - 145 mmol/L 130(L)  Potassium 3.5 - 5.1 mmol/L 4.0  Chloride 98 - 111 mmol/L 103  CO2 22 - 32 mmol/L 20(L)  Calcium 8.9 - 10.3 mg/dL 8.6(L)  Total Protein 6.5 - 8.1 g/dL 4.8(L)  Total Bilirubin 0.3 - 1.2 mg/dL 0.5  Alkaline Phos 38 - 126 U/L 36(L)  AST 15 - 41 U/L 19  ALT 0 - 44 U/L 13   Edinburgh Score: Edinburgh Postnatal Depression Scale Screening Tool 04/10/2020  I have been able to laugh and see the funny side of things. 0  I have looked forward with enjoyment to things. 0  I have blamed myself unnecessarily when things went wrong. 1  I have been anxious or worried for no good reason. 2  I have felt scared or panicky for no good reason. 1  Things have been getting on top of me. 0  I have been so unhappy that I have had difficulty sleeping. 0  I have felt sad or miserable. 0  I have been so unhappy that I have been crying. 0  The thought of  harming myself has occurred to me. 0  Edinburgh Postnatal Depression Scale Total 4      After visit meds:  Allergies as of 04/12/2020      Reactions   Azithromycin Nausea Only   Nausea only      Medication List    STOP taking these medications   aspirin EC 81 MG tablet   vitamin C 500 MG tablet Commonly known as: ASCORBIC ACID     TAKE these medications   acetaminophen 325 MG tablet Commonly known as: TYLENOL Take 650 mg by mouth every 8 (eight) hours as needed (pain).   calcium carbonate 500 MG chewable tablet Commonly known as: TUMS - dosed in mg elemental calcium Chew 1-2 tablets by mouth 3 (three) times daily as needed for indigestion or heartburn.   ferrous sulfate 325 (65 FE) MG tablet Take 325 mg by mouth daily with breakfast.   ibuprofen 800 MG tablet Commonly known as: ADVIL Take 1 tablet (800 mg total) by mouth every 8 (eight) hours. What changed:   medication strength  how much to take  when to take this  reasons to take this   LORazepam 0.5  MG tablet Commonly known as: Ativan Take 1 tablet (0.5 mg total) by mouth every 8 (eight) hours. What changed:   when to take this  reasons to take this   metoprolol tartrate 50 MG tablet Commonly known as: LOPRESSOR Take 1 tablet (50 mg total) by mouth daily as needed. What changed: Another medication with the same name was added. Make sure you understand how and when to take each.   metoprolol tartrate 50 MG tablet Commonly known as: LOPRESSOR Take 1 tablet (50 mg total) by mouth daily. What changed: You were already taking a medication with the same name, and this prescription was added. Make sure you understand how and when to take each.   omeprazole 40 MG capsule Commonly known as: PRILOSEC TAKE ONE CAPSULE BY MOUTH EVERY DAY   oxyCODONE 5 MG immediate release tablet Commonly known as: Oxy IR/ROXICODONE Take 1 tablet (5 mg total) by mouth every 4 (four) hours as needed for severe pain.   prenatal vitamin w/FE, FA 27-1 MG Tabs tablet Take 1 tablet by mouth daily.   PROBIOTIC DAILY PO Take 1 tablet by mouth daily.        Discharge home in stable condition Infant Feeding: Breast Infant Disposition:home with mother Discharge instruction: per After Visit Summary and Postpartum booklet. Activity: Advance as tolerated. Pelvic rest for 6 weeks.  Diet: routine diet  Postpartum Appointment:2-3 days Follow up Visit:  Follow-up Information    Cassie Compton, MD. Schedule an appointment as soon as possible for a visit in 3 day(s).   Specialty: Obstetrics and Gynecology Why: for BP check Contact information: [address] [place] [address] [phone]                   04/12/2020 Cassie Duke, MD

## 2020-04-12 NOTE — Lactation Note (Signed)
This note was copied from a baby's chart. Lactation Consultation Note  Patient Name: Cassie Kim BFXOV'A Date: 04/12/2020 Reason for consult: Follow-up assessment;Infant weight loss;Early term 37-38.6wks Age:44 hours Mother and infant to d/c today. Infant with early, excessive wt loss and additional 35g loss overnight. Mother with +breast changes today. She is pumping p bf and offering EBM to infant. Last pumping yielded 25cc.   Baby was bf during visit. LC observed sleepy infant requiring stimulation to suckle. LC did not hear audible swallowing but mom states she has heard swallows. She is aware to "pester" to keep infant engaged during feeding.   Mother to f/u with Peds tomorrow. She will continue supplementing per recommendations. She is aware of reasons to f/u with Ped earlier. Mother is aware of community resources for bf support prn. Patient was provided with the opportunity to ask questions. All concerns were addressed.    Feeding Mother's Current Feeding Choice: Breast Milk  Interventions Interventions: Education;Expressed milk;DEBP;Ice  Discharge Discharge Education: Engorgement and breast care;Warning signs for feeding baby;Outpatient recommendation  Consult Status Consult Status: Complete Follow-up type: In-patient   Gwynne Edinger, MA IBCLC 04/12/2020, 9:06 AM

## 2020-04-12 NOTE — Progress Notes (Addendum)
POD #3 LTCS Doing well, some pain but tolerable Afeb, VSS, BP 140-150/70s Abd- soft, fundus firm, incision intact  Since BP creeping up will restart her Lopressor 50 mg po daily, but still ok for discharge and will check BP later this week in the office

## 2021-04-09 IMAGING — US US ABDOMEN LIMITED
1 series · 14 of 25 positions shown · non-contrast
Comparison: Abdominal ultrasound June 15, 2015

CLINICAL DATA: Dyspepsia

EXAM:
ULTRASOUND ABDOMEN LIMITED RIGHT UPPER QUADRANT

[Series 1: us abdomen limited · 0.17mm/px · 14 of 49 slices shown]
[im 1/49]
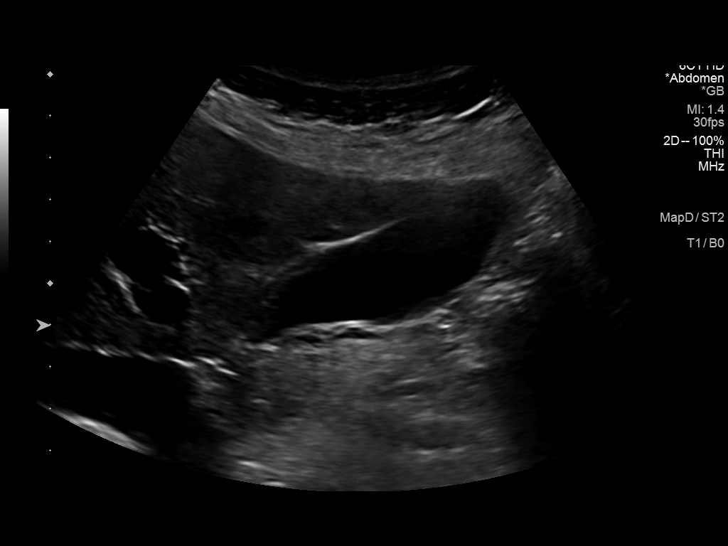
[im 5/49]
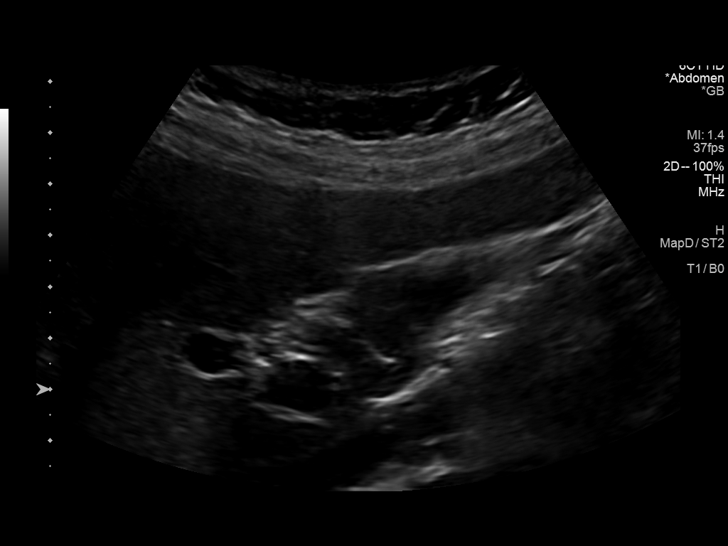
[im 9/49]
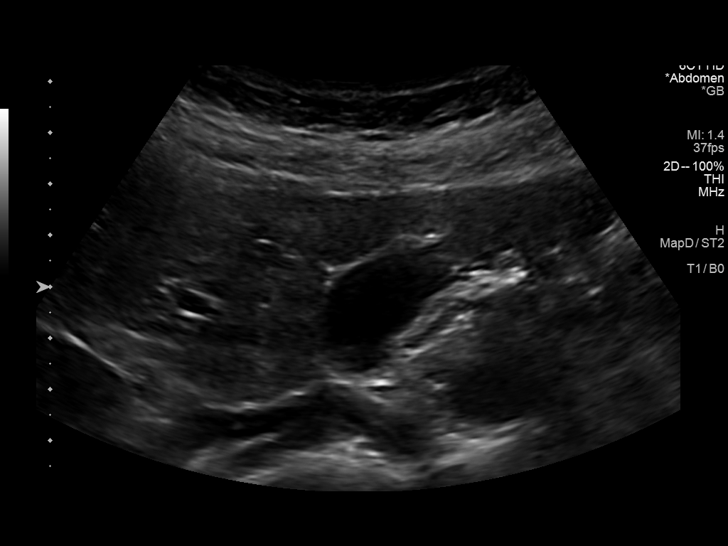
[im 13/49]
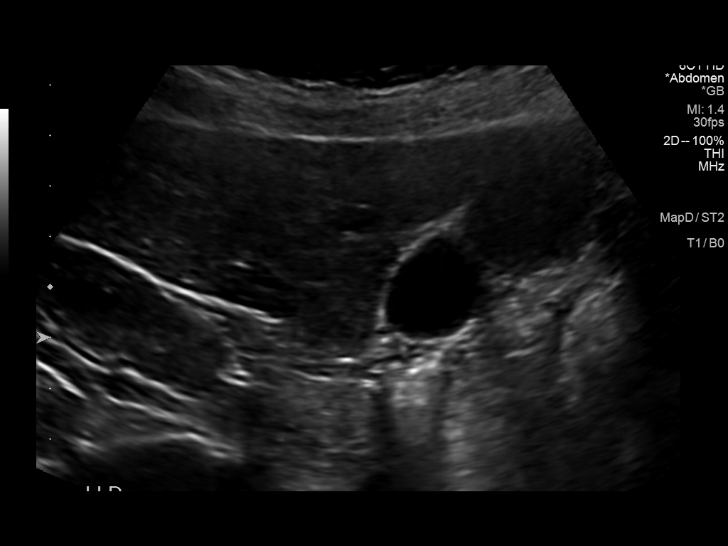
[im 17/49]
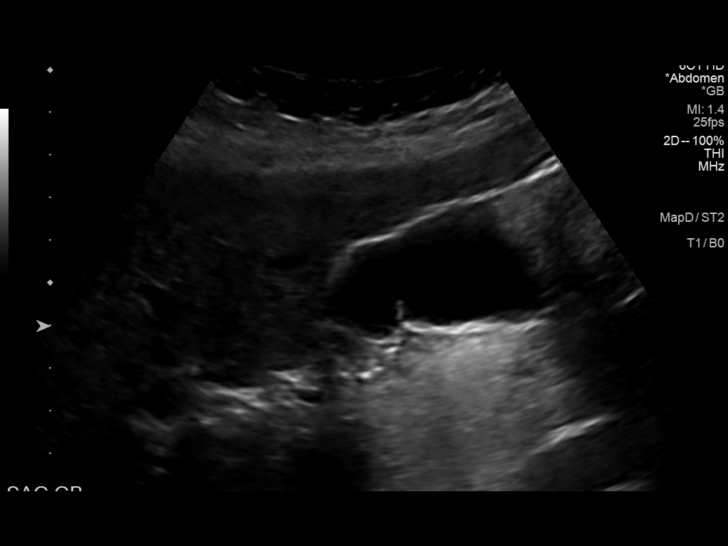
[im 19/49]
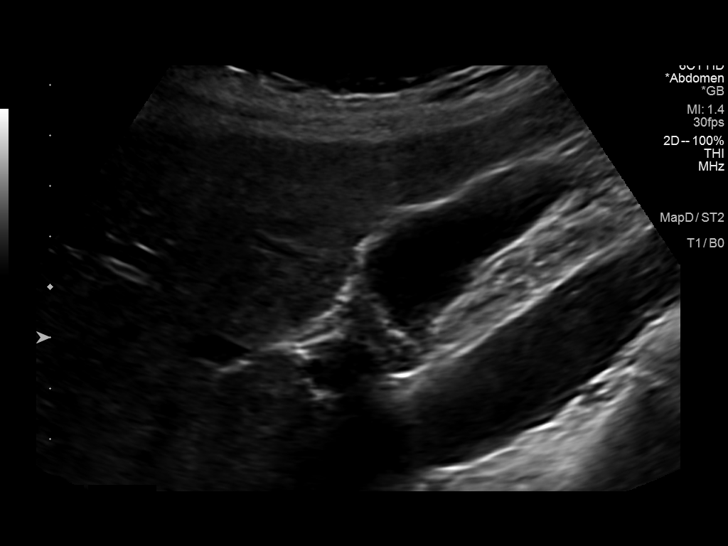
[im 23/49]
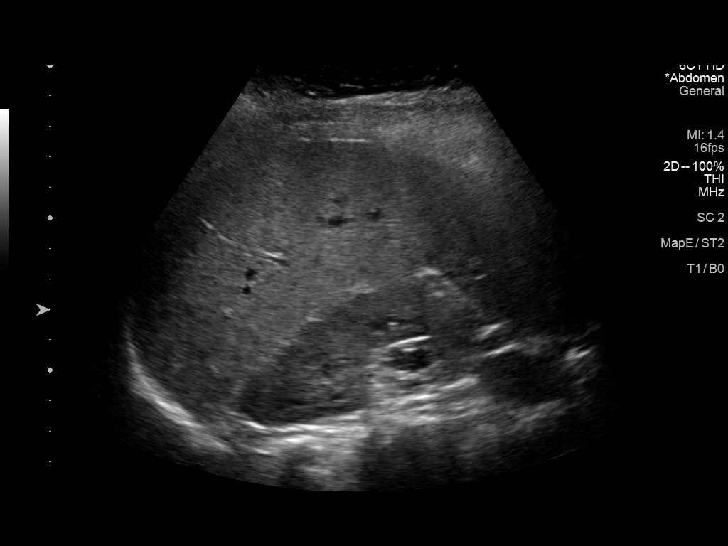
[im 27/49]
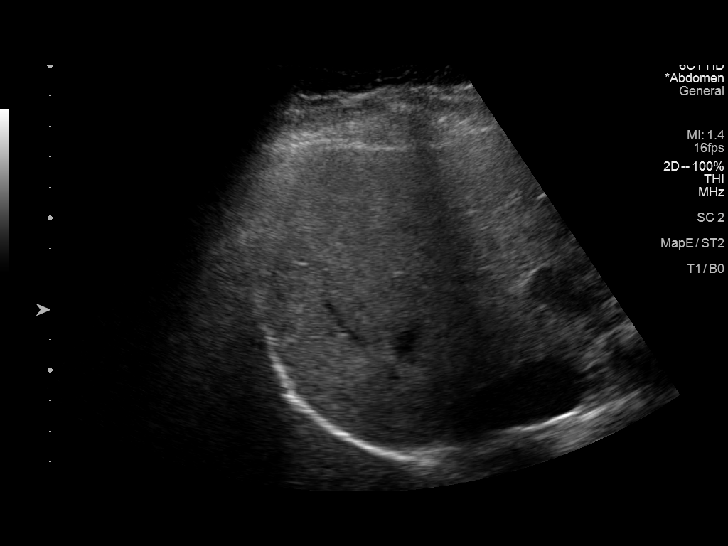
[im 31/49]
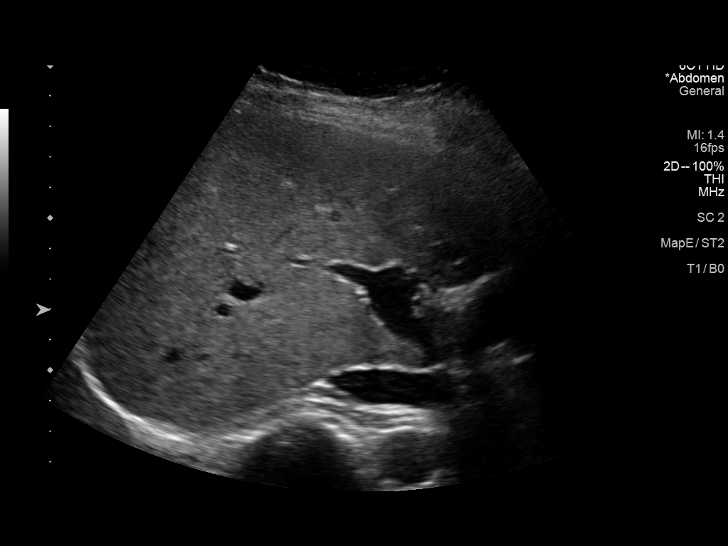
[im 33/49]
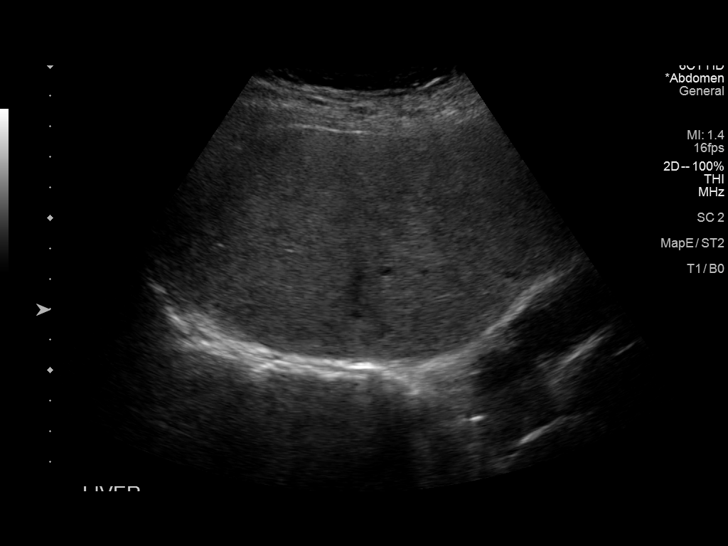
[im 37/49]
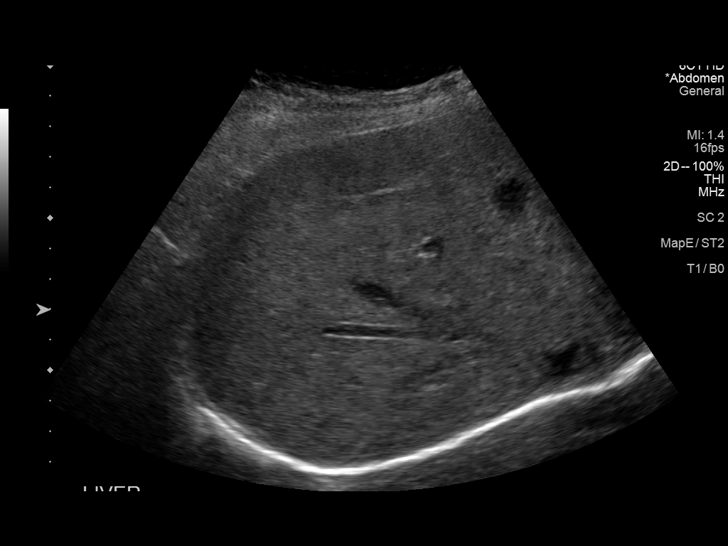
[im 41/49]
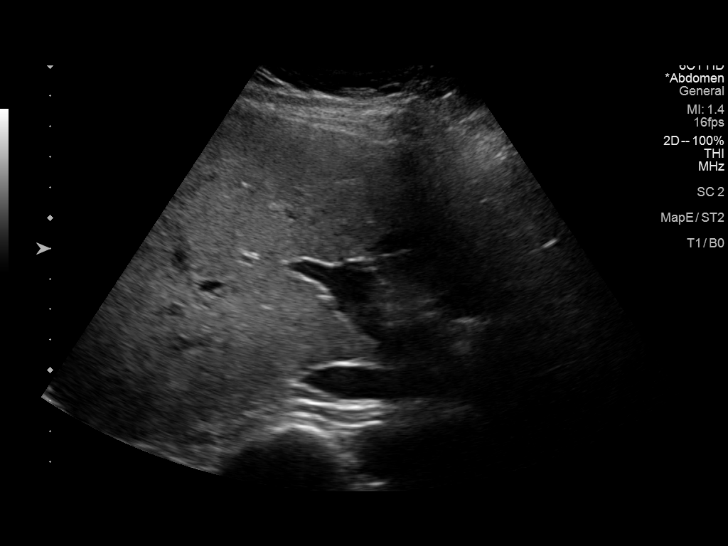
[im 45/49]
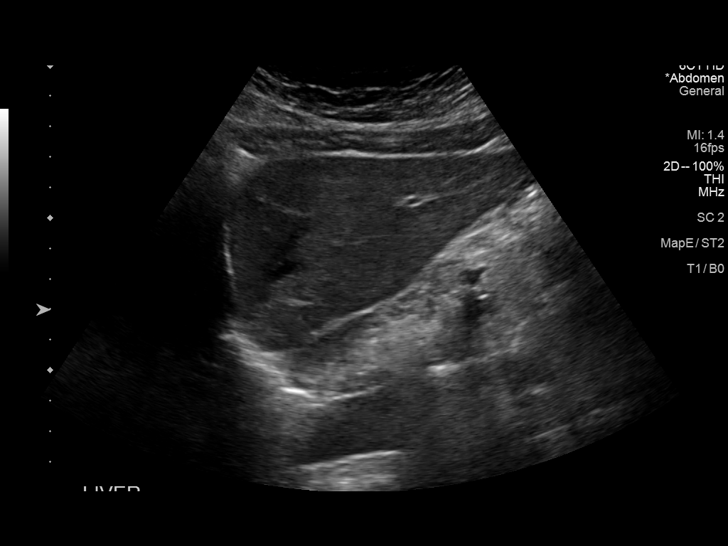
[im 49/49]
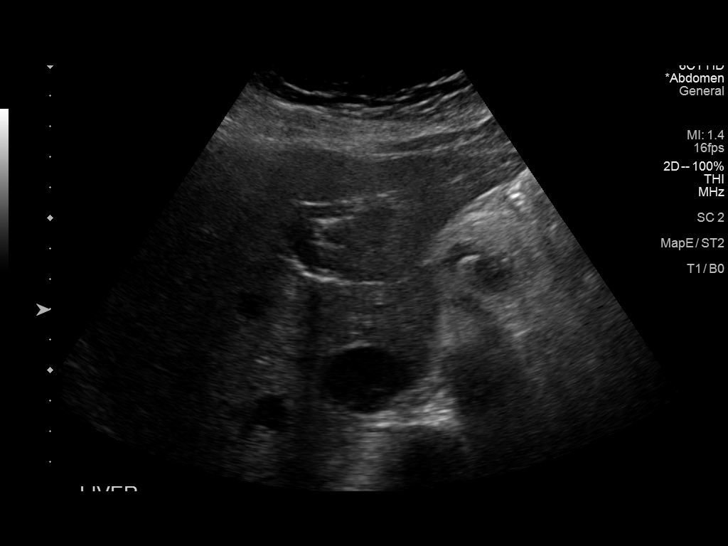

[14 of 25 positions shown; findings below may reference images not displayed]

FINDINGS: Gallbladder:

No gallstones or wall thickening visualized. There is no
pericholecystic fluid. No sonographic Murphy sign noted by
sonographer.

Common bile duct:

Diameter: 3 mm. No intrahepatic or extrahepatic biliary duct
dilatation.

Liver:

No focal lesion identified. Within normal limits in parenchymal
echogenicity. Portal vein is patent on color Doppler imaging with
normal direction of blood flow towards the liver.

Other: None.
IMPRESSION: Study within normal limits.

## 2023-05-16 ENCOUNTER — Encounter: Payer: Self-pay | Admitting: Cardiology

## 2023-05-16 ENCOUNTER — Telehealth: Payer: Self-pay | Admitting: Cardiology

## 2023-05-16 ENCOUNTER — Other Ambulatory Visit: Payer: Self-pay

## 2023-05-16 ENCOUNTER — Ambulatory Visit: Attending: Cardiology | Admitting: Cardiology

## 2023-05-16 ENCOUNTER — Ambulatory Visit

## 2023-05-16 VITALS — BP 144/90 | HR 88 | Ht 67.0 in | Wt 217.0 lb

## 2023-05-16 DIAGNOSIS — I493 Ventricular premature depolarization: Secondary | ICD-10-CM | POA: Diagnosis not present

## 2023-05-16 DIAGNOSIS — E669 Obesity, unspecified: Secondary | ICD-10-CM | POA: Diagnosis not present

## 2023-05-16 DIAGNOSIS — R002 Palpitations: Secondary | ICD-10-CM

## 2023-05-16 DIAGNOSIS — Z8249 Family history of ischemic heart disease and other diseases of the circulatory system: Secondary | ICD-10-CM

## 2023-05-16 DIAGNOSIS — R011 Cardiac murmur, unspecified: Secondary | ICD-10-CM | POA: Diagnosis not present

## 2023-05-16 NOTE — Telephone Encounter (Signed)
 Pt stated that she would have to wait on the Calcium score since it is out of pocket cost. She will go forward with the Echo and reschedule the Ca Score as she is able to afford it.

## 2023-05-16 NOTE — Telephone Encounter (Signed)
 Pt calling to make provider aware that she will not be having CT test that was ordered, due to it  ot being covered by insurance. Please advise

## 2023-05-16 NOTE — Patient Instructions (Addendum)
 Medication Instructions:  Your physician recommends that you continue on your current medications as directed. Please refer to the Current Medication list given to you today.  *If you need a refill on your cardiac medications before your next appointment, please call your pharmacy*   Lab Work: None Ordered If you have labs (blood work) drawn today and your tests are completely normal, you will receive your results only by: MyChart Message (if you have MyChart) OR A paper copy in the mail If you have any lab test that is abnormal or we need to change your treatment, we will call you to review the results.   Testing/Procedures: Your physician has requested that you have an echocardiogram. Echocardiography is a painless test that uses sound waves to create images of your heart. It provides your doctor with information about the size and shape of your heart and how well your heart's chambers and valves are working. This procedure takes approximately one hour. There are no restrictions for this procedure. Please do NOT wear cologne, perfume, aftershave, or lotions (deodorant is allowed). Please arrive 15 minutes prior to your appointment time.  Please note: We ask at that you not bring children with you during ultrasound (echo/ vascular) testing. Due to room size and safety concerns, children are not allowed in the ultrasound rooms during exams. Our front office staff cannot provide observation of children in our lobby area while testing is being conducted. An adult accompanying a patient to their appointment will only be allowed in the ultrasound room at the discretion of the ultrasound technician under special circumstances. We apologize for any inconvenience.    WHY IS MY DOCTOR PRESCRIBING ZIO? The Zio system is proven and trusted by physicians to detect and diagnose irregular heart rhythms -- and has been prescribed to hundreds of thousands of patients.  The FDA has cleared the Zio system to  monitor for many different kinds of irregular heart rhythms. In a study, physicians were able to reach a diagnosis 90% of the time with the Zio system1.  You can wear the Zio monitor -- a small, discreet, comfortable patch -- during your normal day-to-day activity, including while you sleep, shower, and exercise, while it records every single heartbeat for analysis.  1Barrett, P., et al. Comparison of 24 Hour Holter Monitoring Versus 14 Day Novel Adhesive Patch Electrocardiographic Monitoring. American Journal of Medicine, 2014.  ZIO VS. HOLTER MONITORING The Zio monitor can be comfortably worn for up to 14 days. Holter monitors can be worn for 24 to 48 hours, limiting the time to record any irregular heart rhythms you may have. Zio is able to capture data for the 51% of patients who have their first symptom-triggered arrhythmia after 48 hours.1  LIVE WITHOUT RESTRICTIONS The Zio ambulatory cardiac monitor is a small, unobtrusive, and water-resistant patch--you might even forget you're wearing it. The Zio monitor records and stores every beat of your heart, whether you're sleeping, working out, or showering.    We will order CT coronary calcium score. It will cost $99.00 and is not covered by insurance.  Please call to schedule.    [facility] [address] [place], [place] [address] [phone]     Follow-Up: At [facility], you and your health needs are our priority.  As part of our continuing mission to provide you with exceptional heart care, we have created designated Provider Care Teams.  These Care Teams include your primary Cardiologist (physician) and Advanced Practice Providers (APPs -  Physician Assistants and Nurse Practitioners) who all work together to provide you with the care you need, when you need it.  We recommend signing up for the patient portal called "MyChart".  Sign up information is provided on this After Visit Summary.  MyChart is used to connect  with patients for Virtual Visits (Telemedicine).  Patients are able to view lab/test results, encounter notes, upcoming appointments, etc.  Non-urgent messages can be sent to your provider as well.   To learn more about what you can do with MyChart, go to [email or web address].    Your next appointment:   2 month(s)  The format for your next appointment:   In Person  Provider:   Gypsy Balsam, MD    Other Instructions NA

## 2023-05-16 NOTE — Progress Notes (Unsigned)
 Cardiology Consultation:    Date:  05/16/2023   ID:  Cassie Kim, DOB 05-11-1975, MRN [id number]  PCP:  Shirlean Mylar, MD  Cardiologist:  Gypsy Balsam, MD   Referring MD: Shirlean Mylar, MD   Chief Complaint  Patient presents with   pvc   fluttering    History of Present Illness:    Cassie Kim is a 48 y.o. female who is being seen today for the evaluation of palpitations at the request of Shirlean Mylar, MD. past medical history significant for palpitations, frequent PVCs investigated 2019, evaluation of the time included echocardiogram showed mild mitral regurgitation, she was put on small dose of beta-blocker with partial relief.  She started having any more palpitations also described to have some more sustained arrhythmia lasting for few seconds.  No dizziness no passing out no chest pain tightness squeezing pressure in the chest during the time.  She is try to really be more active return to work on regular basis, no difficulty doing it interestingly few months ago she was in her grandfather funeral and that she is having tightness in the chest heaviness in the chest lasting for a couple of minutes.  Does not smoke is not on any special diet.  Recently get some was discharged from hospital with some neurological issues  Past Medical History:  Diagnosis Date   Anxiety    Was on Zoloft & Ativan pre-pregnancy prn   Anxiety    Childhood asthma    no problems since childhood   Constipation    Dysrhythmia    h/o palpitations with work-up in 2011 [facility]   Dysrhythmia    PAC or PVC   Family history of adverse reaction to anesthesia    Grandmother does unsure of reaction   GERD (gastroesophageal reflux disease)    Takes Zantac   Headache(784.0)    Hypertension    Pregnancy induced hypertension     Past Surgical History:  Procedure Laterality Date   CESAREAN SECTION     CESAREAN SECTION N/A 05/29/2012   Procedure: CESAREAN SECTION;  Surgeon: Philip Aspen,  DO;  Location: [facility];  Service: Obstetrics;  Laterality: N/A;   CESAREAN SECTION     CESAREAN SECTION N/A 04/09/2020   Procedure: REPEAT CESAREAN SECTION;  Surgeon: Huel Cote, MD;  Location: [facility];  Service: Obstetrics;  Laterality: N/A;  Request RNFA if one by then    Current Medications: Current Meds  Medication Sig   acetaminophen (TYLENOL) 325 MG tablet Take 650 mg by mouth every 8 (eight) hours as needed (pain).   busPIRone (BUSPAR) 10 MG tablet Take 10 mg by mouth 2 (two) times daily.   calcium carbonate (TUMS - DOSED IN MG ELEMENTAL CALCIUM) 500 MG chewable tablet Chew 1-2 tablets by mouth 3 (three) times daily as needed for indigestion or heartburn.   ferrous sulfate 325 (65 FE) MG tablet Take 325 mg by mouth daily with breakfast.   ibuprofen (ADVIL) 800 MG tablet Take 1 tablet (800 mg total) by mouth every 8 (eight) hours.   LORazepam (ATIVAN) 0.5 MG tablet Take 1 tablet (0.5 mg total) by mouth every 8 (eight) hours. (Patient taking differently: Take 0.5 mg by mouth every 8 (eight) hours as needed for anxiety.)   metoprolol tartrate (LOPRESSOR) 50 MG tablet Take 1 tablet (50 mg total) by mouth daily. (Patient taking differently: Take 25 mg by mouth daily.)   sertraline (ZOLOFT) 25 MG tablet Take 25 mg by mouth daily.   [  DISCONTINUED] metoprolol tartrate (LOPRESSOR) 50 MG tablet Take 1 tablet (50 mg total) by mouth daily as needed.   [DISCONTINUED] omeprazole (PRILOSEC) 40 MG capsule TAKE ONE CAPSULE BY MOUTH EVERY DAY   [DISCONTINUED] oxyCODONE (OXY IR/ROXICODONE) 5 MG immediate release tablet Take 1 tablet (5 mg total) by mouth every 4 (four) hours as needed for severe pain.   [DISCONTINUED] prenatal vitamin w/FE, FA (PRENATAL 1 + 1) 27-1 MG TABS tablet Take 1 tablet by mouth daily.   [DISCONTINUED] Probiotic Product (PROBIOTIC DAILY PO) Take 1 tablet by mouth daily.     Allergies:   Iodinated contrast media and Azithromycin   Social History   Socioeconomic History    Marital status: Married    Spouse name: [name]   Number of children: 6   Years of education: college   Highest education level: Not on file  Occupational History   Not on file  Tobacco Use   Smoking status: Never   Smokeless tobacco: Never  Vaping Use   Vaping status: Never Used  Substance and Sexual Activity   Alcohol use: Not Currently    Comment: rarely   Drug use: Never   Sexual activity: Not on file  Other Topics Concern   Not on file  Social History Narrative   ** Merged History Encounter **       Patient is married Primary school teacher).   Patient is a homemaker, previously a Pension scheme manager.   Patient has 6 children (4 biological and 2 step-children).   Patient rarely drinks caffeine.         Social Drivers of Corporate investment banker Strain: Not on file  Food Insecurity: Not on file  Transportation Needs: Not on file  Physical Activity: Not on file  Stress: Not on file  Social Connections: Not on file     Family History: The patient's family history includes Hypertension in her maternal grandmother and paternal grandmother. ROS:   Please see the history of present illness.    All 14 point review of systems negative except as described per history of present illness.  EKGs/Labs/Other Studies Reviewed:    The following studies were reviewed today:   EKG:       Recent Labs: No results found for requested labs within last 365 days.  Recent Lipid Panel No results found for: "CHOL", "TRIG", "HDL", "CHOLHDL", "VLDL", "LDLCALC", "LDLDIRECT"  Physical Exam:    VS:  BP (!) 144/90 (BP Location: Right Arm, Patient Position: Sitting)   Pulse 88   Ht 5\' 7"  (1.702 m)   Wt 217 lb (98.4 kg)   SpO2 97%   BMI 33.99 kg/m     Wt Readings from Last 3 Encounters:  05/16/23 217 lb (98.4 kg)  04/09/20 243 lb (110.2 kg)  04/02/20 243 lb (110.2 kg)     GEN:  Well nourished, well developed in no acute distress HEENT: Normal NECK: No JVD; No carotid  bruits LYMPHATICS: No lymphadenopathy CARDIAC: RRR, no murmurs, no rubs, no gallops RESPIRATORY:  Clear to auscultation without rales, wheezing or rhonchi  ABDOMEN: Soft, non-tender, non-distended MUSCULOSKELETAL:  No edema; No deformity  SKIN: Warm and dry NEUROLOGIC:  Alert and oriented x 3 PSYCHIATRIC:  Normal affect   ASSESSMENT:    1. Frequent PVCs   2. Palpitations   3. Obesity (BMI 30-39.9)   4. Murmur, cardiac    PLAN:    In order of problems listed above:    PVCs.  Will put monitor to determine  the burden of palpitations, also she describes a new finding which is more sustained arrhythmia obviously concerning.  Will schedule when he does monitor.  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  I will schedule her to have echocardiogram to assess mitral valve regurgitation and also look at ventricle ejection fraction. Dyslipidemia, I did review K PN which show me LDL 111 HDL 57.  She did have 1 episode of chest pain which is somewhat worrisome.  Could be just related to stress however scheduled to have calcium score to help me stratify her risk factors.   Medication Adjustments/Labs and Tests Ordered: Current medicines are reviewed at length with the patient today.  Concerns regarding medicines are outlined above.  Orders Placed This Encounter  Procedures   EKG 12-Lead   No orders of the defined types were placed in this encounter.   Signed, Georgeanna Lea, MD, [facility]. 05/16/2023 11:26 AM     [facility]

## 2023-05-17 ENCOUNTER — Other Ambulatory Visit

## 2023-05-18 ENCOUNTER — Ambulatory Visit (HOSPITAL_COMMUNITY): Attending: Cardiology

## 2023-05-18 DIAGNOSIS — R011 Cardiac murmur, unspecified: Secondary | ICD-10-CM | POA: Insufficient documentation

## 2023-05-18 DIAGNOSIS — R0600 Dyspnea, unspecified: Secondary | ICD-10-CM

## 2023-05-18 LAB — ECHOCARDIOGRAM COMPLETE
Area-P 1/2: 3.71 cm2
S' Lateral: 3.3 cm

## 2023-05-29 ENCOUNTER — Telehealth: Payer: Self-pay

## 2023-05-29 NOTE — Telephone Encounter (Signed)
 Patient notified through my chart.

## 2023-05-29 NOTE — Telephone Encounter (Signed)
-----   Message from Ralene Burger sent at 05/29/2023  8:40 AM EDT ----- Echocardiogram showed ejection fraction 55 to 60% which is normal overall trivial mitral valve regurgitation overall looks good

## 2023-06-11 DIAGNOSIS — R002 Palpitations: Secondary | ICD-10-CM | POA: Diagnosis not present

## 2023-06-15 ENCOUNTER — Telehealth: Payer: Self-pay

## 2023-06-15 NOTE — Telephone Encounter (Signed)
 Pt viewed monitor results on My Chart per Dr. Vanetta Shawl note. Routed to PCP.

## 2023-07-24 ENCOUNTER — Ambulatory Visit: Attending: Cardiology | Admitting: Cardiology

## 2023-07-24 ENCOUNTER — Encounter: Payer: Self-pay | Admitting: Cardiology

## 2023-07-24 VITALS — Ht 67.0 in | Wt 200.0 lb

## 2023-07-24 DIAGNOSIS — K219 Gastro-esophageal reflux disease without esophagitis: Secondary | ICD-10-CM

## 2023-07-24 DIAGNOSIS — I493 Ventricular premature depolarization: Secondary | ICD-10-CM

## 2023-07-24 DIAGNOSIS — I10 Essential (primary) hypertension: Secondary | ICD-10-CM

## 2023-07-24 DIAGNOSIS — R002 Palpitations: Secondary | ICD-10-CM

## 2023-07-24 NOTE — Progress Notes (Unsigned)
 Virtual Visit via Video Note   Because of Cassie Kim's co-morbid illnesses, she is at least at moderate risk for complications without adequate follow up.  This format is felt to be most appropriate for this patient at this time.  All issues noted in this document were discussed and addressed.  A limited physical exam was performed with this format.  Please refer to the patient's chart for her consent to telehealth for [facility].  Video Connection Lost Video connection was lost at > 50% of the duration of this visit, at which time the remainder of the visit was completed via audio only.    Evaluation Performed:  Follow-up visit  This visit type was conducted due to national recommendations for restrictions regarding the COVID-19 Pandemic (e.g. social distancing).  This format is felt to be most appropriate for this patient at this time.  All issues noted in this document were discussed and addressed.  No physical exam was performed (except for noted visual exam findings with Video Visits).  Please refer to the patient's chart (MyChart message for video visits and phone note for telephone visits) for the patient's consent to telehealth for [facility].  Date:  07/24/2023  ID: [name], DOB [birth year], MRN [id number]   Patient Location: [address] [place] [address]   Provider location:   [facility] [place] [facility]  PCP:  Sylvester Evert, MD  Cardiologist:  Ralene Burger, MD     Chief Complaint: Doing fine  History of Present Illness:    Cassie Kim is a 48 y.o. female  who presents via audio/video conferencing for a telehealth visit today.  With palpitations, prior evaluation included monitor which showed some PVCs, at this time she also wore monitor which showed PVCs as well as echocardiogram done showed preserved left ventricle ejection fraction.  Overall burden this arrhythmia is very low less than 1%.  She had a video visit with  me today however we could not establish voice communication therefore we finished with telephone conversation.  She said she is feeling better she takes metoprolol  titrate only 25 mg daily.  She thinks it helps her.  Denies have any chest pain tightness squeezing pressure burning chest.  We were talking previously about doing a calcium score however she did not realize this is out-of-pocket expense and she does not want to do it likely denies have any chest pain tightness squeezing pressure burning chest   The patient does not have symptoms concerning for COVID-19 infection (fever, chills, cough, or new SHORTNESS OF BREATH).    Prior CV studies:   The following studies were reviewed today:       Past Medical History:  Diagnosis Date   Anxiety    Was on Zoloft  & Ativan  pre-pregnancy prn   Anxiety    Childhood asthma    no problems since childhood   Constipation    Dysrhythmia    h/o palpitations with work-up in 2011 [facility]   Dysrhythmia    PAC or PVC   Family history of adverse reaction to anesthesia    Grandmother does unsure of reaction   GERD (gastroesophageal reflux disease)    Takes Zantac   Headache(784.0)    Hypertension    Pregnancy induced hypertension     Past Surgical History:  Procedure Laterality Date   CESAREAN SECTION     CESAREAN SECTION N/A 05/29/2012   Procedure: CESAREAN SECTION;  Surgeon: Atlas Blank, DO;  Location:  [facility];  Service: Obstetrics;  Laterality: N/A;   CESAREAN SECTION     CESAREAN SECTION N/A 04/09/2020   Procedure: REPEAT CESAREAN SECTION;  Surgeon: Rogene Claude, MD;  Location: [facility];  Service: Obstetrics;  Laterality: N/A;  Request RNFA if one by then     Current Meds  Medication Sig   acetaminophen  (TYLENOL ) 325 MG tablet Take 650 mg by mouth every 8 (eight) hours as needed (pain).   busPIRone (BUSPAR) 10 MG tablet Take 10 mg by mouth 2 (two) times daily. Has not started   calcium carbonate (TUMS - DOSED IN MG  ELEMENTAL CALCIUM) 500 MG chewable tablet Chew 1-2 tablets by mouth 3 (three) times daily as needed for indigestion or heartburn.   ferrous sulfate  325 (65 FE) MG tablet Take 325 mg by mouth daily with breakfast.   ibuprofen  (ADVIL ) 800 MG tablet Take 1 tablet (800 mg total) by mouth every 8 (eight) hours.   LORazepam  (ATIVAN ) 0.5 MG tablet Take 1 tablet (0.5 mg total) by mouth every 8 (eight) hours. (Patient taking differently: Take 0.5 mg by mouth every 8 (eight) hours as needed for anxiety.)   metoprolol  tartrate (LOPRESSOR ) 50 MG tablet Take 1 tablet (50 mg total) by mouth daily. (Patient taking differently: Take 25 mg by mouth daily.)   sertraline  (ZOLOFT ) 25 MG tablet Take 25 mg by mouth daily. Has not stated      Family History: The patient's family history includes Hypertension in her maternal grandmother and paternal grandmother.   ROS:   Please see the history of present illness.     All other systems reviewed and are negative.   Labs/Other Tests and Data Reviewed:     Recent Labs: No results found for requested labs within last 365 days.  Recent Lipid Panel No results found for: CHOL, TRIG, HDL, CHOLHDL, VLDL, LDLCALC, LDLDIRECT    Exam:    Vital Signs:  Ht 5' 7 (1.702 m)   Wt 200 lb (90.7 kg)   BMI 31.32 kg/m     Wt Readings from Last 3 Encounters:  07/24/23 200 lb (90.7 kg)  05/16/23 217 lb (98.4 kg)  04/09/20 243 lb (110.2 kg)     Well nourished, well developed in no acute distress. Alert awake oriented x 3.  Initially we have video link she look good without any distress.  After that phone conversation.  Diagnosis for this visit:   1. Frequent PVCs   2. Primary hypertension   3. Gastroesophageal reflux disease, unspecified whether esophagitis present   4. Palpitations      ASSESSMENT & PLAN:    1.  Frequent PVCs.  Monitor showed only PVCs less than 1%.  He is doing well overall.  Will continue present management I told her she can  play a little bit with metoprolol  increase the dose if needed for palpitations. 2.  Essential hypertension blood pressure well-controlled continue present management. 3.  Gastroesophageal reflux disease.  Stable.  COVID-19 Education: The signs and symptoms of COVID-19 were discussed with the patient and how to seek care for testing (follow up with PCP or arrange E-visit).  The importance of social distancing was discussed today.  Patient Risk:   After full review of this patients clinical status, I feel that they are at least moderate risk at this time.  Time:   Today, I have spent 10 minutes with the patient with telehealth technology discussing pt health issues.  I spent 10 minutes reviewing her chart before  the visit.  Visit was finished at 3:40 PM.    Medication Adjustments/Labs and Tests Ordered: Current medicines are reviewed at length with the patient today.  Concerns regarding medicines are outlined above.  No orders of the defined types were placed in this encounter.  Medication changes: No orders of the defined types were placed in this encounter.    Disposition: 1 year follow-up  Signed, Manfred Seed, MD, [facility] 07/24/2023 3:38 PM    [facility]

## 2023-07-24 NOTE — Patient Instructions (Signed)

## 2023-08-13 ENCOUNTER — Encounter: Payer: Self-pay | Admitting: Cardiology

## 2023-10-29 ENCOUNTER — Encounter: Payer: Self-pay | Admitting: Cardiology

## 2023-10-29 NOTE — Telephone Encounter (Signed)
 Pt called in also
